# Patient Record
Sex: Male | Born: 1950
Health system: Southern US, Community
[De-identification: ages and names within clinical notes are randomized; demographics above are authoritative.]

## PROBLEM LIST (undated history)

## (undated) DIAGNOSIS — H409 Unspecified glaucoma: Secondary | ICD-10-CM

## (undated) DIAGNOSIS — R519 Headache, unspecified: Secondary | ICD-10-CM

## (undated) DIAGNOSIS — R51 Headache: Secondary | ICD-10-CM

## (undated) DIAGNOSIS — Z86718 Personal history of other venous thrombosis and embolism: Secondary | ICD-10-CM

## (undated) DIAGNOSIS — N200 Calculus of kidney: Secondary | ICD-10-CM

## (undated) DIAGNOSIS — J301 Allergic rhinitis due to pollen: Secondary | ICD-10-CM

## (undated) DIAGNOSIS — F32A Depression, unspecified: Secondary | ICD-10-CM

## (undated) DIAGNOSIS — F329 Major depressive disorder, single episode, unspecified: Secondary | ICD-10-CM

## (undated) DIAGNOSIS — R030 Elevated blood-pressure reading, without diagnosis of hypertension: Secondary | ICD-10-CM

## (undated) DIAGNOSIS — I809 Phlebitis and thrombophlebitis of unspecified site: Secondary | ICD-10-CM

## (undated) DIAGNOSIS — F509 Eating disorder, unspecified: Secondary | ICD-10-CM

## (undated) HISTORY — DX: Headache, unspecified: R51.9

## (undated) HISTORY — DX: Depression, unspecified: F32.A

## (undated) HISTORY — DX: Calculus of kidney: N20.0

## (undated) HISTORY — DX: Allergic rhinitis due to pollen: J30.1

## (undated) HISTORY — DX: Elevated blood-pressure reading, without diagnosis of hypertension: R03.0

## (undated) HISTORY — DX: Unspecified glaucoma: H40.9

## (undated) HISTORY — DX: Eating disorder, unspecified: F50.9

## (undated) HISTORY — DX: Phlebitis and thrombophlebitis of unspecified site: I80.9

## (undated) HISTORY — DX: Personal history of other venous thrombosis and embolism: Z86.718

## (undated) HISTORY — PX: SHOULDER SURGERY: SHX246

## (undated) HISTORY — DX: Headache: R51

## (undated) HISTORY — DX: Major depressive disorder, single episode, unspecified: F32.9

---

## 1998-03-17 HISTORY — PX: CHOLECYSTECTOMY: SHX55

## 1998-03-17 HISTORY — PX: APPENDECTOMY: SHX54

## 1998-03-17 HISTORY — PX: GASTRIC BYPASS: SHX52

## 1999-03-18 HISTORY — PX: OTHER SURGICAL HISTORY: SHX169

## 2001-03-17 HISTORY — PX: GASTRIC BYPASS: SHX52

## 2015-11-14 ENCOUNTER — Ambulatory Visit (INDEPENDENT_AMBULATORY_CARE_PROVIDER_SITE_OTHER): Payer: Medicare Other | Admitting: Family Medicine

## 2015-11-14 ENCOUNTER — Encounter: Payer: Self-pay | Admitting: Family Medicine

## 2015-11-14 VITALS — BP 102/72 | HR 62 | Temp 97.5°F | Ht 70.0 in | Wt 207.4 lb

## 2015-11-14 DIAGNOSIS — Z13220 Encounter for screening for lipoid disorders: Secondary | ICD-10-CM | POA: Diagnosis not present

## 2015-11-14 DIAGNOSIS — Z23 Encounter for immunization: Secondary | ICD-10-CM | POA: Diagnosis not present

## 2015-11-14 DIAGNOSIS — Z131 Encounter for screening for diabetes mellitus: Secondary | ICD-10-CM

## 2015-11-14 DIAGNOSIS — E038 Other specified hypothyroidism: Secondary | ICD-10-CM | POA: Diagnosis not present

## 2015-11-14 DIAGNOSIS — H409 Unspecified glaucoma: Secondary | ICD-10-CM

## 2015-11-14 DIAGNOSIS — Z87828 Personal history of other (healed) physical injury and trauma: Secondary | ICD-10-CM

## 2015-11-14 DIAGNOSIS — Z130 Encounter for screening for diseases of the blood and blood-forming organs and certain disorders involving the immune mechanism: Secondary | ICD-10-CM

## 2015-11-14 DIAGNOSIS — F3170 Bipolar disorder, currently in remission, most recent episode unspecified: Secondary | ICD-10-CM

## 2015-11-14 DIAGNOSIS — E034 Atrophy of thyroid (acquired): Secondary | ICD-10-CM | POA: Diagnosis not present

## 2015-11-14 DIAGNOSIS — R29898 Other symptoms and signs involving the musculoskeletal system: Secondary | ICD-10-CM

## 2015-11-14 NOTE — Patient Instructions (Addendum)
We will arrange for you to see an eye doctor here in town.   I would also recommend that you establish care with a psychiatrist to help monitor your condition and to help if you are ever not doing well.  Unfortunately I cannot make this referral for you, but let me know if you need any other help in this regard.   You might try looking in [facility], [place] or even [place]. [facility] has a serious Psychiatric nurse.   In the meantime, let me know when you need refills of your medications or if you have any other concerns
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
## 2015-11-14 NOTE — Progress Notes (Signed)
Pre visit review using our clinic review tool, if applicable. No additional management support is needed unless otherwise documented below in the visit note. 

## 2015-11-15 ENCOUNTER — Encounter: Payer: Self-pay | Admitting: Family Medicine

## 2015-11-15 LAB — COMPREHENSIVE METABOLIC PANEL
ALBUMIN: 3.5 g/dL (ref 3.5–5.2)
ALT: 17 U/L (ref 0–53)
AST: 31 U/L (ref 0–37)
Alkaline Phosphatase: 81 U/L (ref 39–117)
BILIRUBIN TOTAL: 0.5 mg/dL (ref 0.2–1.2)
BUN: 28 mg/dL — AB (ref 6–23)
CALCIUM: 8.4 mg/dL (ref 8.4–10.5)
CHLORIDE: 105 meq/L (ref 96–112)
CO2: 31 meq/L (ref 19–32)
CREATININE: 1.14 mg/dL (ref 0.40–1.50)
GFR: 68.97 mL/min (ref >60.00)
Glucose, Bld: 90 mg/dL (ref 70–99)
Potassium: 4.3 mEq/L (ref 3.5–5.1)
SODIUM: 140 meq/L (ref 135–145)
Total Protein: 6.3 g/dL (ref 6.0–8.3)

## 2015-11-15 LAB — LIPID PANEL
CHOL/HDL RATIO: 2
CHOLESTEROL: 134 mg/dL (ref 0–200)
HDL: 57.2 mg/dL (ref >39.00)
LDL CALC: 60 mg/dL (ref 0–99)
NonHDL: 76.83
TRIGLYCERIDES: 83 mg/dL (ref 0.0–149.0)
VLDL: 16.6 mg/dL (ref 0.0–40.0)

## 2015-11-15 LAB — TSH: TSH: 3.7 u[IU]/mL (ref 0.35–4.50)

## 2015-11-15 LAB — CBC
HCT: 40.8 % (ref 39.0–52.0)
Hemoglobin: 13.8 g/dL (ref 13.0–17.0)
MCHC: 33.9 g/dL (ref 30.0–36.0)
MCV: 93 fl (ref 78.0–100.0)
PLATELETS: 118 10*3/uL — AB (ref 150.0–400.0)
RBC: 4.38 Mil/uL (ref 4.22–5.81)
RDW: 16.4 % — ABNORMAL HIGH (ref 11.5–15.5)
WBC: 3.7 10*3/uL — ABNORMAL LOW (ref 4.0–10.5)

## 2015-11-15 LAB — HEMOGLOBIN A1C: HEMOGLOBIN A1C: 5.2 % (ref 4.6–6.5)

## 2015-12-10 ENCOUNTER — Telehealth: Payer: Self-pay | Admitting: Family Medicine

## 2015-12-10 ENCOUNTER — Other Ambulatory Visit: Payer: Self-pay | Admitting: Emergency Medicine

## 2015-12-10 MED ORDER — VENLAFAXINE HCL ER 75 MG PO CP24: 225.0000 mg | ORAL_CAPSULE | Freq: Every day | ORAL | 0 refills | Status: DC

## 2015-12-10 NOTE — Telephone Encounter (Signed)
Pt need refill of this medication. He took his last pills today. He would like it to go to pharmacy below today, but would like it to go to [facility] after todays script. venlafaxine XR (EFFEXOR-XR) 75 MG 24 hr capsule   Pharmacy: [facility] [place], [place], [place] [address]   Patient Phone: [phone] Patient Relation: Self

## 2015-12-24 ENCOUNTER — Encounter (HOSPITAL_BASED_OUTPATIENT_CLINIC_OR_DEPARTMENT_OTHER): Payer: Self-pay | Admitting: *Deleted

## 2015-12-24 ENCOUNTER — Emergency Department (HOSPITAL_BASED_OUTPATIENT_CLINIC_OR_DEPARTMENT_OTHER)
Admission: EM | Admit: 2015-12-24 | Discharge: 2015-12-24 | Disposition: A | Discharge status: Home / Self Care | Payer: Medicare Other | Attending: Emergency Medicine | Admitting: Emergency Medicine

## 2015-12-24 ENCOUNTER — Telehealth: Payer: Self-pay | Admitting: Family Medicine

## 2015-12-24 ENCOUNTER — Emergency Department (HOSPITAL_BASED_OUTPATIENT_CLINIC_OR_DEPARTMENT_OTHER): Payer: Medicare Other

## 2015-12-24 DIAGNOSIS — Z87891 Personal history of nicotine dependence: Secondary | ICD-10-CM | POA: Insufficient documentation

## 2015-12-24 DIAGNOSIS — R609 Edema, unspecified: Secondary | ICD-10-CM

## 2015-12-24 DIAGNOSIS — Z7982 Long term (current) use of aspirin: Secondary | ICD-10-CM | POA: Insufficient documentation

## 2015-12-24 DIAGNOSIS — E039 Hypothyroidism, unspecified: Secondary | ICD-10-CM | POA: Insufficient documentation

## 2015-12-24 DIAGNOSIS — R600 Localized edema: Secondary | ICD-10-CM | POA: Diagnosis not present

## 2015-12-24 DIAGNOSIS — M7989 Other specified soft tissue disorders: Secondary | ICD-10-CM | POA: Diagnosis present

## 2015-12-24 LAB — CBC WITH DIFFERENTIAL/PLATELET
BASOS PCT: 0 %
Basophils Absolute: 0 10*3/uL (ref 0.0–0.1)
EOS PCT: 2 %
Eosinophils Absolute: 0.1 10*3/uL (ref 0.0–0.7)
HEMATOCRIT: 37.9 % — AB (ref 39.0–52.0)
Hemoglobin: 13 g/dL (ref 13.0–17.0)
LYMPHS ABS: 1.1 10*3/uL (ref 0.7–4.0)
Lymphocytes Relative: 35 %
MCH: 31.8 pg (ref 26.0–34.0)
MCHC: 34.3 g/dL (ref 30.0–36.0)
MCV: 92.7 fL (ref 78.0–100.0)
MONOS PCT: 11 %
Monocytes Absolute: 0.3 10*3/uL (ref 0.1–1.0)
NEUTROS ABS: 1.6 10*3/uL — AB (ref 1.7–7.7)
Neutrophils Relative %: 52 %
Platelets: 116 10*3/uL — ABNORMAL LOW (ref 150–400)
RBC: 4.09 MIL/uL — ABNORMAL LOW (ref 4.22–5.81)
RDW: 14.9 % (ref 11.5–15.5)
Smear Review: ADEQUATE
WBC: 3.1 10*3/uL — ABNORMAL LOW (ref 4.0–10.5)

## 2015-12-24 LAB — COMPREHENSIVE METABOLIC PANEL
ALK PHOS: 70 U/L (ref 38–126)
ALT: 15 U/L — AB (ref 17–63)
AST: 25 U/L (ref 15–41)
Albumin: 3.3 g/dL — ABNORMAL LOW (ref 3.5–5.0)
Anion gap: 6 (ref 5–15)
BUN: 29 mg/dL — AB (ref 6–20)
CHLORIDE: 105 mmol/L (ref 101–111)
CO2: 28 mmol/L (ref 22–32)
CREATININE: 1.06 mg/dL (ref 0.61–1.24)
Calcium: 8.4 mg/dL — ABNORMAL LOW (ref 8.9–10.3)
GFR calc Af Amer: >60 mL/min (ref >60)
GFR calc non Af Amer: >60 mL/min (ref >60)
Glucose, Bld: 87 mg/dL (ref 65–99)
Potassium: 3.9 mmol/L (ref 3.5–5.1)
SODIUM: 139 mmol/L (ref 135–145)
Total Bilirubin: 0.5 mg/dL (ref 0.3–1.2)
Total Protein: 6 g/dL — ABNORMAL LOW (ref 6.5–8.1)

## 2015-12-24 LAB — BRAIN NATRIURETIC PEPTIDE: B NATRIURETIC PEPTIDE 5: 136.6 pg/mL — AB (ref 0.0–100.0)

## 2015-12-24 LAB — TROPONIN I: Troponin I: <0.03 ng/mL (ref <0.03)

## 2015-12-24 NOTE — ED Provider Notes (Signed)
Bryan Hodges Note   CSN: [id number] Arrival date & time: 12/24/15  1756   By signing my name below, I, Bryan Hodges and Bryan Hodges, attest that this documentation has been prepared under the direction and in the presence of Montine Circle, PA-C. Electronically Signed: [name] and Bryan Hodges, ED Scribe. 12/24/15. 8:08 PM.  History   Chief Complaint Chief Complaint  Patient presents with  . Leg Pain   The history is provided by the patient. No language interpreter was used.     HPI Comments: Bryan Hodges is a 65 y.o. male who presents to the Emergency Department complaining of moderate,  gradually worsening, pain and swelling in BLE onset 3 days ago. Pt reports associated clear drainage from left leg. Pt reports recent prolonged periods of standing, but denies recent injury or long travel. Pt was seen at [facility] today and sent to ED for further evaluation of possible DVT. He reports he takes qd Lasix. Pt denies h/o CHF. He also denies CP, SOB.   Past Medical History:  Diagnosis Date  . Depression   . Eating disorder   . Elevated blood pressure reading   . Frequent headaches   . Glaucoma   . H/O blood clots   . Hay fever   . Kidney stone   . Phlebitis     Patient Active Problem List   Diagnosis Date Noted  . Hypothyroidism due to acquired atrophy of thyroid 11/14/2015  . Glaucoma 11/14/2015  . Leg weakness, bilateral 11/14/2015  . History of back injury 11/14/2015  . Bipolar affective disorder in remission ([place]) 11/14/2015    Past Surgical History:  Procedure Laterality Date  . APPENDECTOMY  2000  . CHOLECYSTECTOMY  2000  . GASTRIC BYPASS  2000  . GASTRIC BYPASS  2003  . OTHER SURGICAL HISTORY  2001   Reconstructive surgery        Home Medications    Prior to Admission medications   Medication Sig Start Date End Date Taking? Authorizing Hodges  allopurinol (ZYLOPRIM) 300 MG tablet Take 1 tablet by mouth daily. 10/16/15    Historical Provider, MD  aspirin 81 MG tablet Take 81 mg by mouth daily.    Historical Provider, MD  divalproex (DEPAKOTE) 500 MG DR tablet Take 500 mg by mouth 3 (three) times daily.    Historical Provider, MD  dorzolamide-timolol (COSOPT) 22.3-6.8 MG/ML ophthalmic solution Place 1 drop into both eyes 2 (two) times daily. 09/18/15   Historical Provider, MD  furosemide (LASIX) 20 MG tablet Take 1 tablet by mouth 2 (two) times daily. 11/10/15   Historical Provider, MD  gabapentin (NEURONTIN) 800 MG tablet Take 800 mg by mouth 3 (three) times daily.    Historical Provider, MD  latanoprost (XALATAN) 0.005 % ophthalmic solution Place 1 drop into both eyes daily. 09/18/15   Historical Provider, MD  levothyroxine (SYNTHROID, LEVOTHROID) 50 MCG tablet Take 50 mcg by mouth daily before breakfast.    Historical Provider, MD  omeprazole (PRILOSEC) 40 MG capsule Take 1 capsule by mouth daily. 10/16/15   Historical Provider, MD  primidone (MYSOLINE) 50 MG tablet Take 50 mg by mouth daily.    Historical Provider, MD  propranolol (INDERAL) 10 MG tablet Take 10 mg by mouth 2 (two) times daily.    Historical Provider, MD  tamsulosin (FLOMAX) 0.4 MG CAPS capsule Take 1 capsule by mouth daily. 10/16/15   Historical Provider, MD  traMADol (ULTRAM) 50 MG tablet Take 1 tablet by mouth 2 (two)  times daily. 09/17/15   Historical Provider, MD  venlafaxine XR (EFFEXOR-XR) 75 MG 24 hr capsule Take 3 capsules (225 mg total) by mouth daily. 12/10/15   Gay Filler Copland, MD  vitamin B-12 (CYANOCOBALAMIN) 500 MCG tablet Take 500 mcg by mouth daily.    Historical Provider, MD    Family History Family History  Problem Relation Age of Onset  . Arthritis Mother   . Hyperlipidemia Mother   . Heart disease Mother   . Hypertension Mother   . Alcohol abuse Mother   . Heart attack Mother   . Hyperlipidemia Father   . Heart disease Father   . Hypertension Father   . Alcohol abuse Sister   . Other Sister     Brain tumor  . Alcohol abuse  Brother   . Other Brother     Brain tumor  . Ovarian cancer Paternal Aunt   . Diabetes Paternal Aunt   . Stroke Paternal Grandmother     Social History Social History  Substance Use Topics  . Smoking status: Former Smoker    Types: Cigarettes    Quit date: 03/17/1976  . Smokeless tobacco: Former Systems developer    Types: Chew  . Alcohol use Yes     Allergies   Review of patient's allergies indicates no known allergies.   Review of Systems Review of Systems  Respiratory: Negative for shortness of breath.   Cardiovascular: Positive for leg swelling. Negative for chest pain.  Musculoskeletal: Positive for myalgias (legs).  All other systems reviewed and are negative.    Physical Exam Updated Vital Signs BP 108/75   Pulse 65   Temp 97.8 F (36.6 C) (Oral)   Resp 18   Ht 5\' 11"  (1.803 m)   Wt 209 lb (94.8 kg)   SpO2 100%   BMI 29.15 kg/m   Physical Exam Physical Exam  Constitutional: Pt appears well-developed and well-nourished. No distress.  Awake, alert, nontoxic appearance  HENT:  Head: Normocephalic and atraumatic.  Mouth/Throat: Oropharynx is clear and moist. No oropharyngeal exudate.  Eyes: Conjunctivae are normal. No scleral icterus.  Neck: Normal range of motion. Neck supple.  Cardiovascular: Normal rate, regular rhythm and intact distal pulses.   Pulmonary/Chest: Effort normal and breath sounds normal. No respiratory distress. Pt has no wheezes.  Equal chest expansion  Abdominal: Soft. Bowel sounds are normal. Pt exhibits no mass. There is no tenderness. There is no rebound and no guarding.  Musculoskeletal: Normal range of motion. Bilateral edema, mild weeping, no erythema or sign of cellulitis or abscess.  Neurological: Pt is alert.  Speech is clear and goal oriented Moves extremities without ataxia  Skin: Skin is warm and dry. Pt is not diaphoretic.  Psychiatric: Pt has a normal mood and affect.  Nursing note and vitals reviewed.    ED Treatments /  Results   DIAGNOSTIC STUDIES: Oxygen Saturation is 100% on RA, normal by my interpretation.    COORDINATION OF CARE: 8:06 PM Discussed treatment plan with pt at bedside which includes lab work, [place] BLE and pt agreed to plan.  Labs (all labs ordered are listed, but only abnormal results are displayed) Labs Reviewed  CBC WITH DIFFERENTIAL/PLATELET - Abnormal; Notable for the following:       Result Value   WBC 3.1 (*)    RBC 4.09 (*)    HCT 37.9 (*)    All other components within normal limits  BRAIN NATRIURETIC PEPTIDE  COMPREHENSIVE METABOLIC PANEL  TROPONIN I  EKG  EKG Interpretation None       Radiology No results found.  Procedures Procedures (including critical care time)  Medications Ordered in ED Medications - No data to display   Initial Impression / Assessment and Plan / ED Course  I have reviewed the triage vital signs and the nursing notes.  Pertinent labs & imaging results that were available during my care of the patient were reviewed by me and considered in my medical decision making (see chart for details).  Clinical Course    Patient with bilateral edema in lower extremities. Sent to ED to rule out DVT. Ultrasounds are negative for DVT. Patient has no chest pain or shortness breath. Vital signs are stable. He does have a mildly elevated BNP. Patient may benefit from having an outpatient echocardiogram. I have discussed this with the patient, who will discuss it with his primary care doctor. I also recommended that the patient use compression stockings for the next week or so and keep his feet up and elevated.  Final Clinical Impressions(s) / ED Diagnoses   Final diagnoses:  Peripheral edema    New Prescriptions New Prescriptions   No medications on file   I personally performed the services described in this documentation, which was scribed in my presence. The recorded information has been reviewed and is accurate.      Montine Circle,  PA-C 12/24/15 Brent, MD 12/24/15 [phone]

## 2015-12-24 NOTE — Discharge Instructions (Signed)
No blood clots were seen on the ultrasounds today.   Try wearing compression stockings for the next several days. Please follow-up with your doctor.  They may want to order an echocardiogram of your heart if you have not had one recently.

## 2015-12-24 NOTE — ED Triage Notes (Signed)
Pain and swelling in his legs. [facility].

## 2015-12-24 NOTE — Telephone Encounter (Incomplete)
[facility] - Client TELEPHONE ADVICE RECORD   [facility]     Patient Name: Bryan Hodges Initial Comment Caller states her husbands feet and legs are swollen and he has little tiny holes that are leaking clear fluid.   DOB: 06-20-1950      Nurse Assessment  Nurse: Luther Parody, RN, Malachy Mood Date/Time (Eastern Time): 12/24/2015 3:32:56 PM  Confirm and document reason for call. If symptomatic, describe symptoms. You must click the next button to save text entered. ---Caller states that husband has had swelling in both lower extremities for the last 2 days and last pm the left started to weep fluid.  Has the patient traveled out of the country within the last 30 days? ---Not Applicable  Does the patient have any new or worsening symptoms? ---Yes  Will a triage be completed? ---Yes  Related visit to physician within the last 2 weeks? ---No  Does the PT have any chronic conditions? (i.e. diabetes, asthma, etc.) ---Yes  List chronic conditions. ---chronic back pain, PTSD, bipolar  Is this a behavioral health or substance abuse call? ---No    Guidelines     Guideline Title Affirmed Question Affirmed Notes   Leg Swelling and Edema SEVERE leg swelling (e.g., swelling extends above knee, entire leg is swollen, weeping fluid)    Final Disposition User   See Physician within 4 Hours (or PCP triage) Luther Parody, RN, Cheryl     Referrals   GO TO FACILITY OTHER - SPECIFY   Disagree/Comply: Comply

## 2015-12-31 ENCOUNTER — Ambulatory Visit (HOSPITAL_BASED_OUTPATIENT_CLINIC_OR_DEPARTMENT_OTHER)
Admission: RE | Admit: 2015-12-31 | Discharge: 2015-12-31 | Disposition: A | Discharge status: Home / Self Care | Payer: Medicare Other | Source: Ambulatory Visit | Attending: Family Medicine | Admitting: Family Medicine

## 2015-12-31 ENCOUNTER — Encounter: Payer: Self-pay | Admitting: Family Medicine

## 2015-12-31 ENCOUNTER — Ambulatory Visit (INDEPENDENT_AMBULATORY_CARE_PROVIDER_SITE_OTHER): Payer: Medicare Other | Admitting: Family Medicine

## 2015-12-31 VITALS — BP 106/72 | HR 75 | Temp 98.1°F | Ht 71.0 in | Wt 213.0 lb

## 2015-12-31 DIAGNOSIS — J3089 Other allergic rhinitis: Secondary | ICD-10-CM | POA: Diagnosis not present

## 2015-12-31 DIAGNOSIS — R600 Localized edema: Secondary | ICD-10-CM | POA: Diagnosis not present

## 2015-12-31 NOTE — Progress Notes (Signed)
Pre visit review using our clinic review tool, if applicable. No additional management support is needed unless otherwise documented below in the visit note. 
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
## 2015-12-31 NOTE — Patient Instructions (Signed)
Please go to the ground floor imaging department to have your chest x-ray. Then you can go home and I will be in touch with this result. We will also schedule an echocardiogram to make sure your heart is squeezing well.    I will refer you to an allergist to have allergy testing

## 2016-01-09 ENCOUNTER — Telehealth: Payer: Self-pay | Admitting: Family Medicine

## 2016-01-09 ENCOUNTER — Other Ambulatory Visit: Payer: Self-pay | Admitting: Emergency Medicine

## 2016-01-09 MED ORDER — VENLAFAXINE HCL ER 75 MG PO CP24: 225.0000 mg | ORAL_CAPSULE | Freq: Every day | ORAL | 0 refills | Status: DC

## 2016-01-09 MED ORDER — VENLAFAXINE HCL ER 75 MG PO CP24
225.0000 mg | ORAL_CAPSULE | Freq: Every day | ORAL | 0 refills | Status: DC
Start: 2016-01-09 — End: 2016-06-17

## 2016-01-09 NOTE — Telephone Encounter (Signed)
Refill sent per pt request.  

## 2016-01-09 NOTE — Telephone Encounter (Signed)
Caller name: [name] Relationship to patient: Wife Can be reached: [phone] Pharmacy:  [facility], [place] [phone] (Phone) [phone] (Fax)     Reason for call: Request a rx for venlafaxine XR (EFFEXOR-XR) 75 MG 24 hr capsule [id number]    Patient is out of this medication and also request that enough be called in to [facility], [place]. [phone] (Phone) [phone] (Fax)   To last until he receives his mail order

## 2016-01-16 ENCOUNTER — Ambulatory Visit (HOSPITAL_BASED_OUTPATIENT_CLINIC_OR_DEPARTMENT_OTHER)
Admission: RE | Admit: 2016-01-16 | Discharge: 2016-01-16 | Disposition: A | Discharge status: Home / Self Care | Payer: Medicare Other | Source: Ambulatory Visit | Attending: Family Medicine | Admitting: Family Medicine

## 2016-01-16 DIAGNOSIS — R600 Localized edema: Secondary | ICD-10-CM | POA: Diagnosis not present

## 2016-01-16 NOTE — Progress Notes (Signed)
Echocardiogram 2D Echocardiogram has been performed.  Aggie Cosier 01/16/2016, 3:52 PM

## 2016-01-17 ENCOUNTER — Ambulatory Visit: Payer: Self-pay | Admitting: Allergy and Immunology

## 2016-01-17 ENCOUNTER — Telehealth: Payer: Self-pay | Admitting: Family Medicine

## 2016-01-17 DIAGNOSIS — I5022 Chronic systolic (congestive) heart failure: Secondary | ICD-10-CM

## 2016-01-17 NOTE — Telephone Encounter (Signed)
Called to go over his echo- he does have a reduced EF in the 40- 45% range. Reached his wife as Bryan Hodges was not home.  She reports no known history of cardiac problems in the past. He is not doing weights- she will ask him to weigh each morning and contact me next week with an update. Currently on lasix 20 once daily Will refer to cardiology

## 2016-01-18 DIAGNOSIS — H401134 Primary open-angle glaucoma, bilateral, indeterminate stage: Secondary | ICD-10-CM | POA: Diagnosis not present

## 2016-01-18 DIAGNOSIS — Z961 Presence of intraocular lens: Secondary | ICD-10-CM | POA: Diagnosis not present

## 2016-01-18 DIAGNOSIS — Z9889 Other specified postprocedural states: Secondary | ICD-10-CM | POA: Diagnosis not present

## 2016-01-20 ENCOUNTER — Other Ambulatory Visit: Payer: Self-pay | Admitting: Family Medicine

## 2016-01-21 ENCOUNTER — Other Ambulatory Visit: Payer: Self-pay | Admitting: Emergency Medicine

## 2016-02-12 ENCOUNTER — Ambulatory Visit: Payer: Medicare Other | Admitting: Physician Assistant

## 2016-02-20 ENCOUNTER — Other Ambulatory Visit: Payer: Self-pay | Admitting: Family Medicine

## 2016-02-20 ENCOUNTER — Telehealth: Payer: Self-pay | Admitting: Family Medicine

## 2016-02-20 ENCOUNTER — Other Ambulatory Visit: Payer: Self-pay | Admitting: Emergency Medicine

## 2016-02-20 NOTE — Telephone Encounter (Signed)
Patient's spouse is calling to request refills of patient's  propranolol (INDERAL) 10 MG tablet levothyroxine (SYNTHROID, LEVOTHROID) 50 MCG tablet  venlafaxine XR (EFFEXOR-XR) 75 MG 24 hr capsule Vitamin D-3   Pharmacy: [facility]  [address] [place], [place] [address] [phone]

## 2016-02-21 ENCOUNTER — Other Ambulatory Visit: Payer: Self-pay | Admitting: Emergency Medicine

## 2016-02-21 MED ORDER — VITAMIN B-12 500 MCG PO TABS: 500.0000 ug | ORAL_TABLET | Freq: Every day | ORAL | 3 refills | Status: AC

## 2016-02-21 MED ORDER — PROPRANOLOL HCL 10 MG PO TABS: 10.0000 mg | ORAL_TABLET | Freq: Two times a day (BID) | ORAL | 3 refills | Status: DC

## 2016-02-21 MED ORDER — LEVOTHYROXINE SODIUM 50 MCG PO TABS: 50.0000 ug | ORAL_TABLET | Freq: Every day | ORAL | 3 refills | Status: DC

## 2016-02-21 MED ORDER — VENLAFAXINE HCL ER 75 MG PO CP24: 225.0000 mg | ORAL_CAPSULE | Freq: Every day | ORAL | 3 refills | Status: DC

## 2016-02-21 NOTE — Telephone Encounter (Signed)
Refills sent as requested

## 2016-02-21 NOTE — Telephone Encounter (Signed)
Received refill request for Propranolon,Levothyroxine, Venlafaxine, and Vitamin B12. Sent refills to [facility] on [address] per pt request

## 2016-03-03 ENCOUNTER — Ambulatory Visit: Payer: Medicare Other | Admitting: Internal Medicine

## 2016-03-19 NOTE — Progress Notes (Addendum)
Subjective:   Bryan Hodges is a 66 y.o. male who presents for an Initial Medicare Annual Wellness Visit.  Review of Systems  No ROS.  Medicare Wellness Visit. Cardiac Risk Factors include: advanced age (>72men, >28 women);male gender;sedentary lifestyle Sleep patterns: Takes Zquil to sleep. Sleep about 10 hrs. Feels rested. Home Safety/Smoke Alarms: Smoke detectors in place.  Living environment; residence and Firearm Safety: Lives with wife in town home. Guns properly stored. Feels safe. Seat Belt Safety/Bike Helmet: Wears seat belt.   Counseling:   Eye Exam- has appt this month.  Dental- Looking for new dentist. Moved here 6 months ago.  Male:   CCS-  Per [id number] year. Requesting records  PSA- No results found for: PSA Requesting records      Objective:    Today's Vitals   03/20/16 1427  BP: 105/60  Pulse: 65  SpO2: 98%  Weight: 205 lb (93 kg)  Height: 5\' 11"  (1.803 m)   Body mass index is 28.59 kg/m.  Current Medications (verified) Outpatient Encounter Prescriptions as of 03/20/2016  Medication Sig  . allopurinol (ZYLOPRIM) 300 MG tablet Take 1 tablet by mouth daily.  Marland Kitchen aspirin 81 MG tablet Take 81 mg by mouth daily.  . divalproex (DEPAKOTE) 500 MG DR tablet Take 500 mg by mouth 3 (three) times daily.  . dorzolamide-timolol (COSOPT) 22.3-6.8 MG/ML ophthalmic solution Place 1 drop into both eyes 2 (two) times daily.  . furosemide (LASIX) 20 MG tablet Take 1 tablet by mouth 2 (two) times daily.  Marland Kitchen gabapentin (NEURONTIN) 800 MG tablet Take 800 mg by mouth 3 (three) times daily.  Marland Kitchen latanoprost (XALATAN) 0.005 % ophthalmic solution Place 1 drop into both eyes daily.  Marland Kitchen levothyroxine (SYNTHROID, LEVOTHROID) 50 MCG tablet Take 1 tablet (50 mcg total) by mouth daily before breakfast.  . omeprazole (PRILOSEC) 40 MG capsule Take 1 capsule by mouth daily.  . primidone (MYSOLINE) 50 MG tablet Take 50 mg by mouth daily.  . propranolol (INDERAL) 10 MG tablet Take 1 tablet (10 mg  total) by mouth 2 (two) times daily.  . tamsulosin (FLOMAX) 0.4 MG CAPS capsule Take 1 capsule by mouth daily.  . traMADol (ULTRAM) 50 MG tablet Take 1 tablet by mouth 2 (two) times daily.  Marland Kitchen venlafaxine XR (EFFEXOR-XR) 75 MG 24 hr capsule Take 3 capsules (225 mg total) by mouth daily.  Marland Kitchen venlafaxine XR (EFFEXOR-XR) 75 MG 24 hr capsule Take 3 capsules (225 mg total) by mouth daily.  . vitamin B-12 (CYANOCOBALAMIN) 500 MCG tablet Take 1 tablet (500 mcg total) by mouth daily.   No facility-administered encounter medications on file as of 03/20/2016.     Allergies (verified) Patient has no known allergies.   History: Past Medical History:  Diagnosis Date  . Depression   . Eating disorder   . Elevated blood pressure reading   . Frequent headaches   . Glaucoma   . H/O blood clots   . Hay fever   . Kidney stone   . Phlebitis    Past Surgical History:  Procedure Laterality Date  . APPENDECTOMY  2000  . CHOLECYSTECTOMY  2000  . GASTRIC BYPASS  2000  . GASTRIC BYPASS  2003  . OTHER SURGICAL HISTORY  2001   Reconstructive surgery    Family History  Problem Relation Age of Onset  . Arthritis Mother   . Hyperlipidemia Mother   . Heart disease Mother   . Hypertension Mother   . Alcohol abuse Mother   .  Heart attack Mother   . Hyperlipidemia Father   . Heart disease Father   . Hypertension Father   . Alcohol abuse Sister   . Other Sister     Brain tumor  . Alcohol abuse Brother   . Other Brother     Brain tumor  . Ovarian cancer Paternal Aunt   . Diabetes Paternal Aunt   . Stroke Paternal Grandmother    Social History   Occupational History  . Not on file.   Social History Main Topics  . Smoking status: Former Smoker    Types: Cigarettes    Quit date: 03/17/1976  . Smokeless tobacco: Former Systems developer    Types: Chew  . Alcohol use Yes     Comment: occasoinal beer  . Drug use: No  . Sexual activity: Not on file   Tobacco Counseling Counseling given: No   Activities  of Daily Living In your present state of health, do you have any difficulty performing the following activities: 03/20/2016  Hearing? N  Vision? N  Difficulty concentrating or making decisions? Y  Walking or climbing stairs? Y  Dressing or bathing? N  Doing errands, shopping? N  Preparing Food and eating ? N  Using the Toilet? N  In the past six months, have you accidently leaked urine? N  Do you have problems with loss of bowel control? N  Managing your Medications? N  Managing your Finances? N  Housekeeping or managing your Housekeeping? N    Immunizations and Health Maintenance Immunization History  Administered Date(s) Administered  . Influenza,inj,Quad PF,36+ Mos 11/14/2015   Health Maintenance Due  Topic Date Due  . Hepatitis C Screening  Aug 01, 1950  . HIV Screening  12/08/1967  . TETANUS/TDAP  12/08/1971  . COLONOSCOPY  12/08/2002  . ZOSTAVAX  12/07/2012    Patient Care Team: Darreld Mclean, MD as PCP - General (Family Medicine)  Indicate any recent Medical Services you may have received from other than [facility] providers in the past year (date may be approximate).    Assessment:   This is a routine wellness examination for Bryan Hodges. Physical assessment deferred to PCP.  Hearing/Vision screen  Hearing Screening   125Hz  250Hz  500Hz  1000Hz  2000Hz  3000Hz  4000Hz  6000Hz  8000Hz   Right ear:   Pass Pass Pass  Fail    Left ear:   Pass Pass Pass  Fail    Comments: Able to hear conversational tones w/o difficulty. No issues reported.     Visual Acuity Screening   Right eye Left eye Both eyes  Without correction: 20/20 20/20 20/20   With correction:       Dietary issues and exercise activities discussed: Current Exercise Habits: The patient does not participate in regular exercise at present, Exercise limited by: orthopedic condition(s)     Goals    . To have less chronic pain.      Depression Screen PHQ 2/9 Scores 03/20/2016  PHQ - 2 Score 0    Fall Risk Fall Risk   03/20/2016  Falls in the past year? No    Cognitive Function: MMSE - Mini Mental State Exam 03/20/2016  Orientation to time 5  Orientation to Place 5  Registration 3  Attention/ Calculation 5  Recall 2  Language- name 2 objects 2  Language- repeat 1  Language- follow 3 step command 3  Language- read & follow direction 1  Write a sentence 1  Copy design 1  Total score 29        Screening Tests  Health Maintenance  Topic Date Due  . Hepatitis C Screening  25-Jan-1951  . HIV Screening  12/08/1967  . TETANUS/TDAP  12/08/1971  . COLONOSCOPY  12/08/2002  . ZOSTAVAX  12/07/2012  . INFLUENZA VACCINE  Completed        Plan:     Follow up with Dr.Copland as scheduled. Continue to eat heart healthy diet (full of fruits, vegetables, whole grains, lean protein, water--limit salt, fat, and sugar intake) and increase physical activity as tolerated. Continue doing brain stimulating activities (puzzles, reading, adult coloring books, staying active) to keep memory sharp.   During the course of the visit Bryan Hodges was educated and counseled about the following appropriate screening and preventive services:   Vaccines to include Pneumoccal, Influenza, Hepatitis B, Td, Zostavax, HCV  Colorectal cancer screening  Cardiovascular disease screening  Diabetes screening  Glaucoma screening  Nutrition counseling  Prostate cancer screening  Patient Instructions (the written plan) were given to the patient.   Shela Nevin, [place]   03/20/2016     Read and reviewed, agree with above J Copland MD 03/24/16

## 2016-03-19 NOTE — Progress Notes (Signed)
Pre visit review using our clinic review tool, if applicable. No additional management support is needed unless otherwise documented below in the visit note. 

## 2016-03-20 ENCOUNTER — Ambulatory Visit (INDEPENDENT_AMBULATORY_CARE_PROVIDER_SITE_OTHER): Payer: Medicare Other | Admitting: *Deleted

## 2016-03-20 ENCOUNTER — Encounter: Payer: Self-pay | Admitting: *Deleted

## 2016-03-20 VITALS — BP 105/60 | HR 65 | Ht 71.0 in | Wt 205.0 lb

## 2016-03-20 DIAGNOSIS — Z0000 Encounter for general adult medical examination without abnormal findings: Secondary | ICD-10-CM | POA: Diagnosis not present

## 2016-03-20 NOTE — Patient Instructions (Signed)
Follow up with Dr.Copland as scheduled. Continue to eat heart healthy diet (full of fruits, vegetables, whole grains, lean protein, water--limit salt, fat, and sugar intake) and increase physical activity as tolerated. Continue doing brain stimulating activities (puzzles, reading, adult coloring books, staying active) to keep memory sharp.

## 2016-03-26 ENCOUNTER — Ambulatory Visit (HOSPITAL_BASED_OUTPATIENT_CLINIC_OR_DEPARTMENT_OTHER)
Admission: RE | Admit: 2016-03-26 | Discharge: 2016-03-26 | Disposition: A | Discharge status: Home / Self Care | Payer: Medicare Other | Source: Ambulatory Visit | Attending: Family Medicine | Admitting: Family Medicine

## 2016-03-26 ENCOUNTER — Ambulatory Visit (INDEPENDENT_AMBULATORY_CARE_PROVIDER_SITE_OTHER): Payer: Medicare Other | Admitting: Family Medicine

## 2016-03-26 ENCOUNTER — Encounter: Payer: Self-pay | Admitting: Family Medicine

## 2016-03-26 VITALS — BP 94/68 | HR 80 | Temp 97.7°F | Ht 71.0 in | Wt 206.6 lb

## 2016-03-26 DIAGNOSIS — M25552 Pain in left hip: Secondary | ICD-10-CM | POA: Diagnosis not present

## 2016-03-26 DIAGNOSIS — R200 Anesthesia of skin: Secondary | ICD-10-CM

## 2016-03-26 DIAGNOSIS — M25551 Pain in right hip: Secondary | ICD-10-CM | POA: Diagnosis not present

## 2016-03-26 DIAGNOSIS — M160 Bilateral primary osteoarthritis of hip: Secondary | ICD-10-CM | POA: Insufficient documentation

## 2016-03-26 DIAGNOSIS — I519 Heart disease, unspecified: Secondary | ICD-10-CM | POA: Insufficient documentation

## 2016-03-26 DIAGNOSIS — R600 Localized edema: Secondary | ICD-10-CM | POA: Insufficient documentation

## 2016-03-26 DIAGNOSIS — R251 Tremor, unspecified: Secondary | ICD-10-CM
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
## 2016-03-26 NOTE — Progress Notes (Signed)
Pre visit review using our clinic review tool, if applicable. No additional management support is needed unless otherwise documented below in the visit note. 

## 2016-03-26 NOTE — Patient Instructions (Addendum)
Please go to the ground floor imaging to have x-rays of your hips We are going to have your see neurology for further evaluation of your tremor  I will also order an MRI for your lower back to evaluate you for spinal stenosis.   Please see me in 3 months for a recheck and labs  Try using your lasix only as needed- you may not need it every day and this would be one less medication to take

## 2016-03-27 ENCOUNTER — Encounter: Payer: Self-pay | Admitting: Family Medicine

## 2016-03-29 ENCOUNTER — Ambulatory Visit (HOSPITAL_BASED_OUTPATIENT_CLINIC_OR_DEPARTMENT_OTHER): Payer: Medicare Other

## 2016-03-29 ENCOUNTER — Other Ambulatory Visit (HOSPITAL_BASED_OUTPATIENT_CLINIC_OR_DEPARTMENT_OTHER): Payer: Medicare Other

## 2016-04-05 ENCOUNTER — Ambulatory Visit (HOSPITAL_BASED_OUTPATIENT_CLINIC_OR_DEPARTMENT_OTHER)
Admission: RE | Admit: 2016-04-05 | Discharge: 2016-04-05 | Disposition: A | Discharge status: Home / Self Care | Payer: Medicare Other | Source: Ambulatory Visit | Attending: Family Medicine | Admitting: Family Medicine

## 2016-04-05 ENCOUNTER — Other Ambulatory Visit (HOSPITAL_BASED_OUTPATIENT_CLINIC_OR_DEPARTMENT_OTHER): Payer: Medicare Other

## 2016-04-05 DIAGNOSIS — M5136 Other intervertebral disc degeneration, lumbar region: Secondary | ICD-10-CM | POA: Insufficient documentation

## 2016-04-05 DIAGNOSIS — R200 Anesthesia of skin: Secondary | ICD-10-CM | POA: Diagnosis not present

## 2016-04-05 DIAGNOSIS — M545 Low back pain: Secondary | ICD-10-CM | POA: Diagnosis not present

## 2016-04-09 ENCOUNTER — Telehealth: Payer: Self-pay | Admitting: Family Medicine

## 2016-04-09 DIAGNOSIS — R937 Abnormal findings on diagnostic imaging of other parts of musculoskeletal system: Secondary | ICD-10-CM

## 2016-04-09 NOTE — Telephone Encounter (Signed)
Spoke with his wife about his MRI.  They would like to see NSG about his back and leg pains. Will arrange for them

## 2016-04-13 ENCOUNTER — Telehealth: Payer: Self-pay | Admitting: Family Medicine

## 2016-04-13 NOTE — Telephone Encounter (Signed)
Received records from [facility]

## 2016-04-14 ENCOUNTER — Ambulatory Visit: Payer: Medicare Other | Admitting: Internal Medicine

## 2016-04-22 ENCOUNTER — Ambulatory Visit: Payer: Medicare Other | Admitting: Neurology

## 2016-04-28 ENCOUNTER — Encounter: Payer: Self-pay | Admitting: Neurology

## 2016-04-28 ENCOUNTER — Ambulatory Visit (INDEPENDENT_AMBULATORY_CARE_PROVIDER_SITE_OTHER): Payer: Medicare Other | Admitting: Neurology

## 2016-04-28 VITALS — BP 94/67 | HR 63 | Resp 20 | Ht 70.0 in | Wt 210.0 lb

## 2016-04-28 DIAGNOSIS — G250 Essential tremor: Secondary | ICD-10-CM

## 2016-04-28 MED ORDER — PRIMIDONE 50 MG PO TABS: ORAL_TABLET | ORAL | 5 refills | Status: DC

## 2016-04-28 NOTE — Progress Notes (Signed)
Subjective:    Patient ID: Bryan Hodges is a 66 y.o. male.  HPI     Star Age, MD, PhD [facility] [address], [address] [place], [place] [address]  Dear Dr. Lorelei Pont,   I saw your patient, Bryan Hodges, upon your kind request in my neurologic clinic today for initial consultation of his tremors. The patient is unaccompanied today. As you know, Bryan Hodges is a 66 year old right-handed gentleman with an underlying complex medical history of morbid obesity, status post gastric bypass surgery, bipolar disorder, eating disorder, glaucoma, history of DVTs, kidney stone, phlebitis, back injury, back pain, previously on narcotic pain medications, and overweight state, who reports a longstanding history of bilateral upper extremity tremor of maybe 3-5 years duration. He saw his primary care physician when he still was in [place] and was started on one medication and when they moved to [place] a second medication was added and he saw a neurologist one time. He has a history of low blood pressure and therefore he is just on a low-dose of propranolol at this time, 10 mg twice daily and he has been on Mysoline 50 mg each evening. He has no family history of Parkinson's disease or tremors. He had 3 brothers and 3 sisters, now only 2 sisters are alive, father had multiple sclerosis but died of heart disease at the age 72. Patient has a history of bipolar disorder and has been on high-dose venlafaxine for about 3 years, has been on Depakote longer him a maybe 3-5 years. His tremor is worse on the right side. He has ongoing issues with chronic pain. He is no longer on narcotic pain medication. Has a history of nerve damage. Had EMG and nerve conduction testing in the past and was told he had nerve damage from the back. He had a L spine MRI on 04/05/16, which I reviewed: IMPRESSION: Curvature convex to the right with the apex at L2-3.   Degenerative disc disease and degenerative  facet disease that could be associated with back pain. No clear compressive stenosis is identified. There is mild narrowing of left lateral recess at L2-3, bilateral lateral recesses at L3-4 and of the intervertebral foramen on the right at L5-S1, but definite neural compression is not demonstrated in these locations.   He has an appointment with NSG next week.   He is on disability. He has 5 children. He drinks 1 beer at night, likes to drink sweet tea, typically no sodas, quit smoking in 1978.  His Past Medical History Is Significant For: Past Medical History:  Diagnosis Date  . Depression   . Eating disorder   . Elevated blood pressure reading   . Frequent headaches   . Glaucoma   . H/O blood clots   . Hay fever   . Kidney stone   . Phlebitis     His Past Surgical History Is Significant For: Past Surgical History:  Procedure Laterality Date  . APPENDECTOMY  2000  . CHOLECYSTECTOMY  2000  . GASTRIC BYPASS  2000  . GASTRIC BYPASS  2003  . OTHER SURGICAL HISTORY  2001   Reconstructive surgery   . SHOULDER SURGERY      His Family History Is Significant For: Family History  Problem Relation Age of Onset  . Arthritis Mother   . Hyperlipidemia Mother   . Heart disease Mother   . Hypertension Mother   . Alcohol abuse Mother   . Heart attack Mother   . Hyperlipidemia  Father   . Heart disease Father   . Hypertension Father   . Alcohol abuse Sister   . Other Sister     Brain tumor  . Alcohol abuse Brother   . Other Brother     Brain tumor  . Ovarian cancer Paternal Aunt   . Diabetes Paternal Aunt   . Stroke Paternal Grandmother     His Social History Is Significant For: Social History   Social History  . Marital status: Married    Spouse name: N/A  . Number of children: N/A  . Years of education: N/A   Social History Main Topics  . Smoking status: Former Smoker    Types: Cigarettes    Quit date: 03/17/1976  . Smokeless tobacco: Former Systems developer    Types:  Chew  . Alcohol use Yes     Comment: occasoinal beer  . Drug use: No  . Sexual activity: Not Asked   Other Topics Concern  . None   Social History Narrative  . None    His Allergies Are:  No Known Allergies:   His Current Medications Are:  Outpatient Encounter Prescriptions as of 04/28/2016  Medication Sig  . allopurinol (ZYLOPRIM) 300 MG tablet Take 1 tablet by mouth daily.  Marland Kitchen aspirin 81 MG tablet Take 81 mg by mouth daily.  . divalproex (DEPAKOTE) 500 MG DR tablet Take 500 mg by mouth 3 (three) times daily.  . dorzolamide-timolol (COSOPT) 22.3-6.8 MG/ML ophthalmic solution Place 1 drop into both eyes 2 (two) times daily.  . furosemide (LASIX) 20 MG tablet Take 20 mg by mouth daily.   Marland Kitchen gabapentin (NEURONTIN) 800 MG tablet Take 800 mg by mouth 3 (three) times daily.  Marland Kitchen latanoprost (XALATAN) 0.005 % ophthalmic solution Place 1 drop into both eyes daily.  Marland Kitchen levothyroxine (SYNTHROID, LEVOTHROID) 50 MCG tablet Take 1 tablet (50 mcg total) by mouth daily before breakfast.  . omeprazole (PRILOSEC) 40 MG capsule Take 1 capsule by mouth daily.  . primidone (MYSOLINE) 50 MG tablet Take 50 mg by mouth daily.  . propranolol (INDERAL) 10 MG tablet Take 1 tablet (10 mg total) by mouth 2 (two) times daily.  . tamsulosin (FLOMAX) 0.4 MG CAPS capsule Take 1 capsule by mouth daily.  . traMADol (ULTRAM) 50 MG tablet Take 1 tablet by mouth 2 (two) times daily.  Marland Kitchen venlafaxine XR (EFFEXOR-XR) 75 MG 24 hr capsule Take 3 capsules (225 mg total) by mouth daily.  Marland Kitchen venlafaxine XR (EFFEXOR-XR) 75 MG 24 hr capsule Take 3 capsules (225 mg total) by mouth daily.  . vitamin B-12 (CYANOCOBALAMIN) 500 MCG tablet Take 1 tablet (500 mcg total) by mouth daily.   No facility-administered encounter medications on file as of 04/28/2016.   :   Review of Systems:  Out of a complete 14 point review of systems, all are reviewed and negative with the exception of these symptoms as listed below:  Review of Systems   Musculoskeletal: Positive for arthralgias and myalgias.  Neurological: Positive for tremors, weakness and headaches.       Pt presents today to discuss his tremors. Pt notices the tremors in his bilateral hands and in his chin.    Objective:  Neurologic Exam  Physical Exam Physical Examination:   Vitals:   04/28/16 1444  BP: 94/67  Pulse: 63  Resp: 20   General Examination: The patient is a very pleasant 66 y.o. male in no acute distress. He appears well-developed and well-nourished and well groomed.   HEENT:  Normocephalic, atraumatic, pupils are equal, round and reactive to light and accommodation. Funduscopic exam is normal with sharp disc margins noted. Extraocular tracking is good without limitation to gaze excursion or nystagmus noted. Normal smooth pursuit is noted. Hearing is grossly intact. Face is symmetric with normal facial animation and normal facial sensation. Speech is clear with no dysarthria noted. There is no hypophonia. There is a mild lower lip and jaw tremor. No significant voice tremor is noted. Speech is mildly hypophonic. Neck is supple with full range of passive and active motion. There are no carotid bruits on auscultation. Oropharynx exam reveals: mild mouth dryness, adequate dental hygiene and mild airway crowding.Tongue protrudes centrally and palate elevates symmetrically.  Chest: Clear to auscultation without wheezing, rhonchi or crackles noted.  Heart: S1+S2+0, regular and normal without murmurs, rubs or gallops noted.   Abdomen: Soft, non-tender and non-distended with normal bowel sounds appreciated on auscultation.  Extremities: There is 1+ to 2+ pitting edema in the distal lower extremities bilaterally. Pedal pulses are intact.  Skin: Warm and dry without trophic changes noted. There are no varicose veins, chronic appearing discoloration both distal legs.   Musculoskeletal: exam reveals no obvious joint deformities, tenderness or joint swelling or  erythema.   Neurologically:  Mental status: The patient is awake, alert and oriented in all 4 spheres. His immediate and remote memory, attention, language skills and fund of knowledge are appropriate. There is no evidence of aphasia, agnosia, apraxia or anomia. Speech is clear with normal prosody and enunciation. Thought process is linear. Mood is normal and affect is normal.  Cranial nerves II - XII are as described above under HEENT exam. In addition: shoulder shrug is normal with equal shoulder height noted.  Motor exam: Normal to thing bulk, normal strength and tone in the UEs and 4/5 in the LEs, 4 minus out of 5 with foot dorsiflexion bilaterally, left worse than right.   On 04/28/2016: There is a slight intermittent resting tremor in the R hand. There is a bilateral upper extremity postural and action tremor, which is mild to moderate on the RUE and mild in degree on the L. There tremor frequency is fairly fast and the amplitude is small. On Archimedes spiral drawing there is moderate tremulousness noted on the L and severe on the R. Handwriting is severely tremulous, almost illlegible. There is no evidence of micrographia. Romberg is not testable safely. Reflexes are 2+ in the UEs, 1+ in the knees and trace in the ankles. Babinski: Toes are flexor bilaterally. Fine motor skills and coordination: intact with normal finger taps, normal hand movements, normal rapid alternating patting, normal foot taps and normal foot agility.  Cerebellar testing: No dysmetria or intention tremor on finger to nose testing. Heel to shin is not possible. There is no truncal or gait ataxia.  Sensory exam: intact to light touch, pinprick, vibration, temperature sense in the UEs and decreased to all modalities an proprioception in the upper and lower extremities.  Gait, station and balance: He stands with difficulty. No veering to one side is noted. No leaning to one side is noted. Posture is age-appropriate and stance is  narrow based. Gait shows mild limp on the left, walks with a single-point cane, does not pick up his feet as well. Almost drags his left foot. Tandem walk is not possible. Balance is impaired.  Assessment and Plan:   In summary, Bryan Hodges is a very pleasant 66 y.o.-year old male with an underlying complex medical history of  morbid obesity, status post gastric bypass surgery, bipolar disorder, eating disorder, glaucoma, history of DVTs, kidney stone, phlebitis, back injury, back pain, previously on narcotic pain medications, and overweight state, who presents for initial evaluation of his bilateral upper extremity tremor, findings and history most likely in keeping with essential tremor but no family history is reported, in addition, situation is complicated by his medications, he is taking at least 2 medications, namely generic Effexor and generic Depakote that are both known to exacerbate tremors and some patients. It may be difficult to tease out. He has evidence of neuropathy. I suggested we could proceed with EMG nerve conduction testing but he would like to hold off until he sees the neurosurgeon. I suggested we increase Mysoline gradually took 2 pills at night if possible. Increasing the beta blocker would be too risky to reduce his blood pressure further. He is advised to stay better hydrated with water. We talked about deep brain stimulation surgery and the potential for consultation for this. He is encouraged to think about it. I can make a referral in the near future. I adjusted his Mysoline prescription and suggested a 3 month follow-up, he can see one of our nurse practitioners at the time. I answered all her questions today and the patient and his wife were in agreement.  Thank you very much for allowing me to participate in the care of this nice patient. If I can be of any further assistance to you please do not hesitate to call me at [phone].  Sincerely,   Star Age, MD, PhD

## 2016-04-28 NOTE — Patient Instructions (Addendum)
You have a history and findings in keeping with Essential tremor.  You are taking some meds though, that can exacerbate tremors, including high dose Effexor and you are on Depakote for mood stabilization. You may benefit from seeing a psychiatrist.  For your tremor, continue with low dose propranolol, 10 mg 2 times a day.  Let's try increasing your Primidone 50 mg to 1 1/2 pills each bedtime for 2 weeks, then 2 pills at night thereafter. Common side effects reported are: Sleepiness, drowsiness, balance problems, confusion, and GI related symptoms.  As discussed, we can consider a referral for evaluation for deep brain stimulation (DBS) for treatment of advanced tremor/essential tremor.   Let's consider an EMG and nerve conduction velocity test, which is an electrical nerve and muscle test, which we will schedule. We will call you with the results.

## 2016-04-30 DIAGNOSIS — M542 Cervicalgia: Secondary | ICD-10-CM | POA: Diagnosis not present

## 2016-04-30 DIAGNOSIS — R29898 Other symptoms and signs involving the musculoskeletal system: Secondary | ICD-10-CM | POA: Diagnosis not present

## 2016-04-30 DIAGNOSIS — M4727 Other spondylosis with radiculopathy, lumbosacral region: Secondary | ICD-10-CM | POA: Diagnosis not present

## 2016-04-30 DIAGNOSIS — M549 Dorsalgia, unspecified: Secondary | ICD-10-CM | POA: Diagnosis not present

## 2016-05-02 ENCOUNTER — Ambulatory Visit: Payer: Medicare Other | Admitting: Internal Medicine

## 2016-05-20 ENCOUNTER — Telehealth: Payer: Self-pay | Admitting: Family Medicine

## 2016-05-20 NOTE — Telephone Encounter (Signed)
Caller name: Relationship to patient: Self Can be reached: [phone]  Pharmacy:  [facility], [place]. [phone] (Phone) [phone] (Fax)     Reason for call: Request a referral to an Allergist and also needs refill on Vitamin D

## 2016-05-21 ENCOUNTER — Other Ambulatory Visit: Payer: Self-pay | Admitting: Emergency Medicine

## 2016-05-21 ENCOUNTER — Telehealth: Payer: Self-pay | Admitting: Family Medicine

## 2016-05-21 DIAGNOSIS — J3089 Other allergic rhinitis: Secondary | ICD-10-CM

## 2016-05-21 DIAGNOSIS — M542 Cervicalgia: Secondary | ICD-10-CM | POA: Diagnosis not present

## 2016-05-21 DIAGNOSIS — M5134 Other intervertebral disc degeneration, thoracic region: Secondary | ICD-10-CM | POA: Diagnosis not present

## 2016-05-21 DIAGNOSIS — M549 Dorsalgia, unspecified: Secondary | ICD-10-CM | POA: Diagnosis not present

## 2016-05-21 DIAGNOSIS — Z9884 Bariatric surgery status: Secondary | ICD-10-CM

## 2016-05-21 DIAGNOSIS — E559 Vitamin D deficiency, unspecified: Secondary | ICD-10-CM

## 2016-05-21 MED ORDER — VITAMIN D (ERGOCALCIFEROL) 1.25 MG (50000 UNIT) PO CAPS: ORAL_CAPSULE | ORAL | 3 refills | Status: AC

## 2016-05-21 NOTE — Telephone Encounter (Signed)
Please give him a call- does he take the once weekly high dose vitamin D?   I will make referral to allergist- what are his main concerns for them to evaluate?  Thank you!

## 2016-05-21 NOTE — Telephone Encounter (Signed)
Called patient, left message for return call regarding  Vitamin D.

## 2016-05-21 NOTE — Telephone Encounter (Signed)
Called pt- he reports that he has been taking 50k of Vitamin D twice a week since he has his gastric bypass years ago.  Without this dose he will become quite deficient.  He also needs a referral to allergist for chronic nasal allergy sx- will take care of this for him

## 2016-06-04 NOTE — Telephone Encounter (Signed)
Patient states he takes Vitamin D 50K U twice weekly

## 2016-06-09 ENCOUNTER — Ambulatory Visit: Payer: Medicare Other | Admitting: Pediatrics

## 2016-06-16 ENCOUNTER — Other Ambulatory Visit: Payer: Self-pay | Admitting: Family Medicine

## 2016-06-16 ENCOUNTER — Telehealth: Payer: Self-pay | Admitting: Family Medicine

## 2016-06-16 NOTE — Telephone Encounter (Signed)
°  Relation to LT:JQZE Call back number: [phone] Pharmacy: [facility], [place]. [phone] (Phone) [phone] (Fax)     Reason for call:  Patient requesting a refill  tamsulosin (FLOMAX) 0.4 MG CAPS capsule  furosemide (LASIX) 20 MG tablet (swelling hasn't resolved)

## 2016-06-17 ENCOUNTER — Ambulatory Visit (INDEPENDENT_AMBULATORY_CARE_PROVIDER_SITE_OTHER): Payer: Medicare Other | Admitting: Pediatrics

## 2016-06-17 ENCOUNTER — Other Ambulatory Visit: Payer: Self-pay | Admitting: Emergency Medicine

## 2016-06-17 ENCOUNTER — Encounter: Payer: Self-pay | Admitting: Pediatrics

## 2016-06-17 VITALS — BP 90/64 | HR 60 | Temp 97.9°F | Resp 16 | Ht 70.5 in | Wt 213.0 lb

## 2016-06-17 DIAGNOSIS — Y832 Surgical operation with anastomosis, bypass or graft as the cause of abnormal reaction of the patient, or of later complication, without mention of misadventure at the time of the procedure: Secondary | ICD-10-CM

## 2016-06-17 DIAGNOSIS — J3089 Other allergic rhinitis: Secondary | ICD-10-CM | POA: Insufficient documentation

## 2016-06-17 DIAGNOSIS — M069 Rheumatoid arthritis, unspecified: Secondary | ICD-10-CM | POA: Diagnosis not present

## 2016-06-17 DIAGNOSIS — K9189 Other postprocedural complications and disorders of digestive system: Secondary | ICD-10-CM | POA: Diagnosis not present

## 2016-06-17 DIAGNOSIS — K219 Gastro-esophageal reflux disease without esophagitis: Secondary | ICD-10-CM

## 2016-06-17 DIAGNOSIS — Z79899 Other long term (current) drug therapy: Secondary | ICD-10-CM

## 2016-06-17 MED ORDER — FLUTICASONE PROPIONATE 50 MCG/ACT NA SUSP: 2.0000 | Freq: Every day | NASAL | 5 refills | Status: DC

## 2016-06-17 MED ORDER — FUROSEMIDE 20 MG PO TABS: 20.0000 mg | ORAL_TABLET | Freq: Every day | ORAL | 3 refills | Status: DC

## 2016-06-17 MED ORDER — VENLAFAXINE HCL ER 75 MG PO CP24: 225.0000 mg | ORAL_CAPSULE | Freq: Every day | ORAL | 3 refills | Status: DC

## 2016-06-17 MED ORDER — TAMSULOSIN HCL 0.4 MG PO CAPS: 0.4000 mg | ORAL_CAPSULE | Freq: Every day | ORAL | 3 refills | Status: DC

## 2016-06-17 NOTE — Patient Instructions (Signed)
Environmental controls dust and mold Nasal saline irrigations at night followed by fluticasone 2 sprays per nostril at night Chlor-Trimeton 12 mg Extentabs-take 1 tablet at night to help with the postnasal drainage Add prednisone 10 mg twice a day for 4 days, 10 mg on the fifth day to bring your allergic symptoms under control Continue on your other medications
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
## 2016-06-23 ENCOUNTER — Other Ambulatory Visit: Payer: Self-pay | Admitting: Family Medicine

## 2016-06-26 ENCOUNTER — Ambulatory Visit (INDEPENDENT_AMBULATORY_CARE_PROVIDER_SITE_OTHER): Payer: Medicare Other | Admitting: Family Medicine

## 2016-06-26 ENCOUNTER — Encounter: Payer: Self-pay | Admitting: Family Medicine

## 2016-06-26 VITALS — BP 104/64 | HR 66 | Temp 97.9°F | Ht 70.5 in | Wt 212.0 lb

## 2016-06-26 DIAGNOSIS — E875 Hyperkalemia: Secondary | ICD-10-CM | POA: Diagnosis not present

## 2016-06-26 DIAGNOSIS — Z5181 Encounter for therapeutic drug level monitoring: Secondary | ICD-10-CM | POA: Diagnosis not present

## 2016-06-26 DIAGNOSIS — Z1159 Encounter for screening for other viral diseases: Secondary | ICD-10-CM

## 2016-06-26 DIAGNOSIS — E039 Hypothyroidism, unspecified: Secondary | ICD-10-CM | POA: Diagnosis not present

## 2016-06-26 DIAGNOSIS — Z8639 Personal history of other endocrine, nutritional and metabolic disease: Secondary | ICD-10-CM

## 2016-06-26 LAB — HEPATITIS C ANTIBODY: HCV Ab: NEGATIVE

## 2016-06-26 NOTE — Patient Instructions (Addendum)
It was a pleasure to see you today- take care and I will be in touch with your labs asap  Please see me in about 4 months
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
## 2016-06-27 ENCOUNTER — Encounter: Payer: Self-pay | Admitting: Family Medicine

## 2016-06-27 LAB — BASIC METABOLIC PANEL
BUN: 23 mg/dL (ref 6–23)
CALCIUM: 8.8 mg/dL (ref 8.4–10.5)
CHLORIDE: 108 meq/L (ref 96–112)
CO2: 28 meq/L (ref 19–32)
CREATININE: 1.05 mg/dL (ref 0.40–1.50)
GFR: 75.69 mL/min (ref >60.00)
GLUCOSE: 88 mg/dL (ref 70–99)
Potassium: 5.7 mEq/L — ABNORMAL HIGH (ref 3.5–5.1)
Sodium: 142 mEq/L (ref 135–145)

## 2016-06-27 LAB — VITAMIN D 25 HYDROXY (VIT D DEFICIENCY, FRACTURES): VITD: 74.61 ng/mL (ref 30.00–100.00)

## 2016-06-27 LAB — TSH: TSH: 2.42 u[IU]/mL (ref 0.35–4.50)

## 2016-06-27 NOTE — Addendum Note (Signed)
Addended by: Lamar Blinks C on: 06/27/2016 01:52 PM   Modules accepted: Orders

## 2016-07-28 ENCOUNTER — Ambulatory Visit: Payer: Medicare Other | Admitting: Adult Health

## 2016-09-09 ENCOUNTER — Ambulatory Visit (INDEPENDENT_AMBULATORY_CARE_PROVIDER_SITE_OTHER): Payer: Medicare Other | Admitting: Adult Health

## 2016-09-09 ENCOUNTER — Encounter: Payer: Self-pay | Admitting: Adult Health

## 2016-09-09 VITALS — BP 114/79 | HR 66 | Ht 68.0 in | Wt 208.0 lb

## 2016-09-09 DIAGNOSIS — G250 Essential tremor: Secondary | ICD-10-CM

## 2016-09-09 NOTE — Patient Instructions (Addendum)
Your Plan: Look at [place] prescription and tell us how you are taking. If you have increased dosage- I want you to consider Deep Brain Stimulator.   Thank you for coming to see [place] at [facility]. I hope we have been able to provide you high quality care today.  You may receive a patient satisfaction survey over the next few weeks. We would appreciate your feedback and comments so that we may continue to improve ourselves and the health of our patients.

## 2016-09-09 NOTE — Progress Notes (Signed)
PATIENT: Bryan Hodges DOB: 1951-01-24  REASON FOR VISIT: follow up- essential tremor HISTORY FROM: patient  HISTORY OF PRESENT ILLNESS: Bryan Hodges is a 66 year old male with a history of essential tremor. He returns today for follow-up. At the last visit his Mysoline was increased to 2 tablets at bedtime. The patient's wife manages his medication. She states that she is unsure if they increased medication but she "thinks they have." The patient reports no improvement in tremor. He feels that it is worse. He reports the tremor only occurs in the hands typically when he is trying to do something. He reports that if he's trying to hammer a nail his tremor is worse. The patient is also on low-dose propranolol. He is unable to tolerate higher dosages due to his blood pressure. The patient has researched deep brain stimulator. He may be interested in more information regarding this. The patient also reports that he's been having pain at the base of the skull. He reports a previous doctor gave him an injection however he does not recall what this was. He also has noticed some memory trouble. He reports that he has not discussed either of these things with his primary care provider. He returns today for an evaluation.  HISTORY Copied from Dr. Guadelupe Sabin notes 04/28/16: Bryan Hodges is a 66 year old right-handed gentleman with an underlying complex medical history of morbid obesity, status post gastric bypass surgery, bipolar disorder, eating disorder, glaucoma, history of DVTs, kidney stone, phlebitis, back injury, back pain, previously on narcotic pain medications, and overweight state, who reports a longstanding history of bilateral upper extremity tremor of maybe 3-5 years duration. He saw his primary care physician when he still was in [place] and was started on one medication and when they moved to [place] a second medication was added and he saw a neurologist one time. He has a history of low blood pressure and  therefore he is just on a low-dose of propranolol at this time, 10 mg twice daily and he has been on Mysoline 50 mg each evening. He has no family history of Parkinson's disease or tremors. He had 3 brothers and 3 sisters, now only 2 sisters are alive, father had multiple sclerosis but died of heart disease at the age 42. Patient has a history of bipolar disorder and has been on high-dose venlafaxine for about 3 years, has been on Depakote longer him a maybe 3-5 years. His tremor is worse on the right side. He has ongoing issues with chronic pain. He is no longer on narcotic pain medication. Has a history of nerve damage. Had EMG and nerve conduction testing in the past and was told he had nerve damage from the back. He had a L spine MRI on 04/05/16, which I reviewed: IMPRESSION: Curvature convex to the right with the apex at L2-3.  Degenerative disc disease and degenerative facet disease that could be associated with back pain. No clear compressive stenosis is identified. There is mild narrowing of left lateral recess at L2-3, bilateral lateral recesses at L3-4 and of the intervertebral foramen on the right at L5-S1, but definite neural compression is not demonstrated in these locations.  He has an appointment with NSG next week.   He is on disability. He has 5 children. He drinks 1 beer at night, likes to drink sweet tea, typically no sodas, quit smoking in 1978.  REVIEW OF SYSTEMS: Out of a complete 14 system review of symptoms, the patient complains only of the following symptoms,  and all other reviewed systems are negative.  Fatigue, ringing in ears, runny nose, leg swelling, eye itching, light sensitivity, memory loss, dizziness, headache, numbness, tremors, agitation, confusion, decreased concentration, depression, nervous/anxious, joint pain, back pain, aching muscles, muscle cramps, walking difficulty, neck pain, neck stiffness  ALLERGIES: No Known Allergies  HOME  MEDICATIONS: Outpatient Medications Prior to Visit  Medication Sig Dispense Refill  . allopurinol (ZYLOPRIM) 300 MG tablet Take 1 tablet by mouth daily.    Marland Kitchen aspirin 81 MG tablet Take 81 mg by mouth daily.    . divalproex (DEPAKOTE) 500 MG DR tablet Take 500 mg by mouth 3 (three) times daily.    . dorzolamide-timolol (COSOPT) 22.3-6.8 MG/ML ophthalmic solution Place 1 drop into both eyes 2 (two) times daily.    . fluticasone (FLONASE) 50 MCG/ACT nasal spray Place 2 sprays into both nostrils at bedtime. 16 g 5  . furosemide (LASIX) 20 MG tablet Take 1 tablet (20 mg total) by mouth daily. 90 tablet 3  . gabapentin (NEURONTIN) 800 MG tablet Take 800 mg by mouth 3 (three) times daily.    Marland Kitchen latanoprost (XALATAN) 0.005 % ophthalmic solution Place 1 drop into both eyes daily.    Marland Kitchen levothyroxine (SYNTHROID, LEVOTHROID) 50 MCG tablet TAKE ONE TABLET BY MOUTH ONCE DAILY BEFORE BREAKFAST 30 tablet 3  . omeprazole (PRILOSEC) 40 MG capsule Take 1 capsule by mouth daily.    . primidone (MYSOLINE) 50 MG tablet Take 1 1/2 pills each night for 2 weeks, then 2 pills each bedtime thereafter. 60 tablet 5  . propranolol (INDERAL) 10 MG tablet TAKE ONE TABLET BY MOUTH TWICE DAILY 60 tablet 3  . tamsulosin (FLOMAX) 0.4 MG CAPS capsule Take 1 capsule (0.4 mg total) by mouth daily. 90 capsule 3  . traMADol (ULTRAM) 50 MG tablet Take 1 tablet by mouth 2 (two) times daily.    Marland Kitchen venlafaxine XR (EFFEXOR-XR) 75 MG 24 hr capsule Take 3 capsules (225 mg total) by mouth daily. 270 capsule 3  . vitamin B-12 (CYANOCOBALAMIN) 500 MCG tablet Take 1 tablet (500 mcg total) by mouth daily. 30 tablet 3  . Vitamin D, Ergocalciferol, (DRISDOL) 50000 units CAPS capsule Take 2x a week 30 capsule 3   No facility-administered medications prior to visit.     PAST MEDICAL HISTORY: Past Medical History:  Diagnosis Date  . Depression   . Eating disorder   . Elevated blood pressure reading   . Frequent headaches   . Glaucoma   . H/O  blood clots   . Hay fever   . Kidney stone   . Phlebitis     PAST SURGICAL HISTORY: Past Surgical History:  Procedure Laterality Date  . APPENDECTOMY  2000  . CHOLECYSTECTOMY  2000  . GASTRIC BYPASS  2000  . GASTRIC BYPASS  2003  . OTHER SURGICAL HISTORY  2001   Reconstructive surgery   . SHOULDER SURGERY      FAMILY HISTORY: Family History  Problem Relation Age of Onset  . Arthritis Mother   . Hyperlipidemia Mother   . Heart disease Mother   . Hypertension Mother   . Alcohol abuse Mother   . Heart attack Mother   . Hyperlipidemia Father   . Heart disease Father   . Hypertension Father   . Alcohol abuse Sister   . Other Sister        Brain tumor  . Alcohol abuse Brother   . Other Brother        Brain tumor  .  Ovarian cancer Paternal Aunt   . Diabetes Paternal Aunt   . Stroke Paternal Grandmother   . Asthma Neg Hx   . Eczema Neg Hx   . Urticaria Neg Hx   . Immunodeficiency Neg Hx   . Angioedema Neg Hx   . Allergic rhinitis Neg Hx     SOCIAL HISTORY: Social History   Social History  . Marital status: Married    Spouse name: N/A  . Number of children: N/A  . Years of education: N/A   Occupational History  . Not on file.   Social History Main Topics  . Smoking status: Former Smoker    Types: Cigarettes    Quit date: 03/17/1976  . Smokeless tobacco: Former Systems developer    Types: Chew  . Alcohol use Yes     Comment: occasoinal beer  . Drug use: No  . Sexual activity: Not on file   Other Topics Concern  . Not on file   Social History Narrative  . No narrative on file      PHYSICAL EXAM  Vitals:   09/09/16 1429  BP: 114/79  Pulse: 66  Weight: 208 lb (94.3 kg)  Height: 5\' 8"  (1.727 m)   Body mass index is 31.63 kg/m.  Generalized: Well developed, in no acute distress   Neurological examination  Mentation: Alert oriented to time, place, history taking. Follows all commands speech and language fluent Cranial nerve II-XII: Pupils were equal  round reactive to light. Extraocular movements were full, visual field were full on confrontational test. Facial sensation and strength were normal. Uvula tongue midline. Head turning and shoulder shrug  were normal and symmetric. Motor: The motor testing reveals 5 over 5 strength of all 4 extremities. Good symmetric motor tone is noted throughout. Contingent tremor noted in both hands. Sensory: Sensory testing is intact to soft touch on all 4 extremities. No evidence of extinction is noted.  Coordination: Cerebellar testing reveals good finger-nose-finger and heel-to-shin bilaterally. Intention tremor noted in both hands Gait and station: Patient uses a cane when ambulating. Tandem gait not attempted. Reflexes: Deep tendon reflexes are symmetric and normal bilaterally.   DIAGNOSTIC DATA (LABS, IMAGING, TESTING) - I reviewed patient records, labs, notes, testing and imaging myself where available.  Lab Results  Component Value Date   WBC 3.1 (L) 12/24/2015   HGB 13.0 12/24/2015   HCT 37.9 (L) 12/24/2015   MCV 92.7 12/24/2015   PLT 116 (L) 12/24/2015      Component Value Date/Time   NA 142 06/26/2016 1433   K 5.7 (H) 06/26/2016 1433   CL 108 06/26/2016 1433   CO2 28 06/26/2016 1433   GLUCOSE 88 06/26/2016 1433   BUN 23 06/26/2016 1433   CREATININE 1.05 06/26/2016 1433   CALCIUM 8.8 06/26/2016 1433   PROT 6.0 (L) 12/24/2015 1930   ALBUMIN 3.3 (L) 12/24/2015 1930   AST 25 12/24/2015 1930   ALT 15 (L) 12/24/2015 1930   ALKPHOS 70 12/24/2015 1930   BILITOT 0.5 12/24/2015 1930   GFRNONAA >60 12/24/2015 1930   GFRAA >60 12/24/2015 1930   Lab Results  Component Value Date   CHOL 134 11/14/2015   HDL 57.20 11/14/2015   LDLCALC 60 11/14/2015   TRIG 83.0 11/14/2015   CHOLHDL 2 11/14/2015   Lab Results  Component Value Date   HGBA1C 5.2 11/14/2015   No results found for: [id number] Lab Results  Component Value Date   TSH 2.42 06/26/2016      ASSESSMENT AND  PLAN 66 y.o.  year old male  has a past medical history of Depression; Eating disorder; Elevated blood pressure reading; Frequent headaches; Glaucoma; H/O blood clots; Hay fever; Kidney stone; and Phlebitis. here with:  1. Essential tremor  I advised the patient and his wife checked his medication. He should ensure that he states in Mysoline 50 mg 2 tablets at bedtime. The patient would also like to be referred to discuss potential for deep brain stimulator. He states that he prefers to see someone locally. I will refer to Wells Guiles Tat for consultation. Patient is amenable to this plan. He will follow-up in 6 months or sooner if needed. The patient plans to discuss new symptoms with his primary care provider.  I spent 15 minutes with the patient. 50% of this time was spent discussing treatment options with patient.      Ward Givens, MSN, NP-C 09/09/2016, 2:45 PM [facility] [address], [place], [place] [address] [phone]

## 2016-09-10 NOTE — Progress Notes (Signed)
I agree with the assessment and plan as directed by NP .The patient is known to me .   Bryan Tercero, MD  

## 2016-10-02 ENCOUNTER — Other Ambulatory Visit: Payer: Self-pay | Admitting: Emergency Medicine

## 2016-10-02 MED ORDER — GABAPENTIN 800 MG PO TABS: 800.0000 mg | ORAL_TABLET | Freq: Three times a day (TID) | ORAL | 1 refills | Status: DC

## 2016-10-05 NOTE — Progress Notes (Deleted)
Subjective:   Bryan Hodges was seen in consultation in the movement disorder clinic at the request of Ward Givens.  His PCP is  Copland, Gay Filler, MD.  The evaluation is for possible DBS for essential tremor. This patient is accompanied in the office by his {companion:[id number]} who supplements the history.  The records that were made available to me were reviewed.   Tremor started approximately *** ago and involves the ***.  Tremor is most noticeable when ***.   There is *** family hx of tremor.  Pt is on propranolol, 10 mg bid (unable to tolerate higher dosages due to drops in BP).  He is also on primidone, 50 mg, 2 po q hs.  He is on gabapentin, 800 mg tid but that is not specifically for tremor but for ***.  Pt is on Depakote, 1500 mg daily and reports that he has been on that for ***.  Affected by caffeine:  {yes no:314532} Affected by alcohol:  {yes no:314532} Affected by stress:  {yes no:314532} Affected by fatigue:  {yes no:314532} Spills soup if on spoon:  {yes no:314532} Spills glass of liquid if full:  {yes no:314532} Affects ADL's (tying shoes, brushing teeth, etc):  {yes no:314532}  Current/Previously tried tremor medications: ***on propranolol (unable to tolerate higher dosages due to drops in BP); primidone; on gabapentin 800 mg tid  Current medications that may exacerbate tremor:  ***Depakote  Outside reports reviewed: {Outside review:[id number]}.  Neuroimaging of the brain has not been performed.    No Known Allergies  Outpatient Encounter Prescriptions as of 10/07/2016  Medication Sig  . allopurinol (ZYLOPRIM) 300 MG tablet Take 1 tablet by mouth daily.  Marland Kitchen aspirin 81 MG tablet Take 81 mg by mouth daily.  . divalproex (DEPAKOTE) 500 MG DR tablet Take 500 mg by mouth 3 (three) times daily.  . dorzolamide-timolol (COSOPT) 22.3-6.8 MG/ML ophthalmic solution Place 1 drop into both eyes 2 (two) times daily.  . fluticasone (FLONASE) 50 MCG/ACT nasal spray Place 2 sprays into both  nostrils at bedtime.  . furosemide (LASIX) 20 MG tablet Take 1 tablet (20 mg total) by mouth daily.  Marland Kitchen gabapentin (NEURONTIN) 800 MG tablet Take 1 tablet (800 mg total) by mouth 3 (three) times daily.  Marland Kitchen latanoprost (XALATAN) 0.005 % ophthalmic solution Place 1 drop into both eyes daily.  Marland Kitchen levothyroxine (SYNTHROID, LEVOTHROID) 50 MCG tablet TAKE ONE TABLET BY MOUTH ONCE DAILY BEFORE BREAKFAST  . omeprazole (PRILOSEC) 40 MG capsule Take 1 capsule by mouth daily.  . primidone (MYSOLINE) 50 MG tablet Take 1 1/2 pills each night for 2 weeks, then 2 pills each bedtime thereafter.  . propranolol (INDERAL) 10 MG tablet TAKE ONE TABLET BY MOUTH TWICE DAILY  . tamsulosin (FLOMAX) 0.4 MG CAPS capsule Take 1 capsule (0.4 mg total) by mouth daily.  . traMADol (ULTRAM) 50 MG tablet Take 1 tablet by mouth 2 (two) times daily.  Marland Kitchen venlafaxine XR (EFFEXOR-XR) 75 MG 24 hr capsule Take 3 capsules (225 mg total) by mouth daily.  . vitamin B-12 (CYANOCOBALAMIN) 500 MCG tablet Take 1 tablet (500 mcg total) by mouth daily.  . Vitamin D, Ergocalciferol, (DRISDOL) 50000 units CAPS capsule Take 2x a week   No facility-administered encounter medications on file as of 10/07/2016.     Past Medical History:  Diagnosis Date  . Depression   . Eating disorder   . Elevated blood pressure reading   . Frequent headaches   . Glaucoma   . H/O blood clots   .  Hay fever   . Kidney stone   . Phlebitis     Past Surgical History:  Procedure Laterality Date  . APPENDECTOMY  2000  . CHOLECYSTECTOMY  2000  . GASTRIC BYPASS  2000  . GASTRIC BYPASS  2003  . OTHER SURGICAL HISTORY  2001   Reconstructive surgery   . SHOULDER SURGERY      Social History   Social History  . Marital status: Married    Spouse name: N/A  . Number of children: N/A  . Years of education: N/A   Occupational History  . Not on file.   Social History Main Topics  . Smoking status: Former Smoker    Types: Cigarettes    Quit date: 03/17/1976    . Smokeless tobacco: Former Systems developer    Types: Chew  . Alcohol use Yes     Comment: occasoinal beer  . Drug use: No  . Sexual activity: Not on file   Other Topics Concern  . Not on file   Social History Narrative  . No narrative on file    Family Status  Relation Status  . Mother (Not Specified)  . Father (Not Specified)  . Sister (Not Specified)  . Brother (Not Specified)  . Ethlyn Daniels (Not Specified)  . PGM (Not Specified)  . Neg Hx (Not Specified)    Review of Systems A complete 10 system ROS was obtained and was negative apart from what is mentioned.   Objective:   VITALS:  There were no vitals filed for this visit. Gen:  Appears stated age and in NAD. HEENT:  Normocephalic, atraumatic. The mucous membranes are moist. The superficial temporal arteries are without ropiness or tenderness. Cardiovascular: Regular rate and rhythm. Lungs: Clear to auscultation bilaterally. Neck: There are no carotid bruits noted bilaterally.  NEUROLOGICAL:  Orientation:  The patient is alert and oriented x 3.  Recent and remote memory are intact.  Attention span and concentration are normal.  Able to name objects and repeat without trouble.  Fund of knowledge is appropriate Cranial nerves: There is good facial symmetry. The pupils are equal round and reactive to light bilaterally. Fundoscopic exam reveals clear disc margins bilaterally. Extraocular muscles are intact and visual fields are full to confrontational testing. Speech is fluent and clear. Soft palate rises symmetrically and there is no tongue deviation. Hearing is intact to conversational tone. Tone: Tone is good throughout. Sensation: Sensation is intact to light touch and pinprick throughout (facial, trunk, extremities). Vibration is intact at the bilateral big toe. There is no extinction with double simultaneous stimulation. There is no sensory dermatomal level identified. Coordination:  The patient has no dysdiadichokinesia or  dysmetria. Motor: Strength is 5/5 in the bilateral upper and lower extremities.  Shoulder shrug is equal bilaterally.  There is no pronator drift.  There are no fasciculations noted. DTR's: Deep tendon reflexes are 2/4 at the bilateral biceps, triceps, brachioradialis, patella and achilles.  Plantar responses are downgoing bilaterally. Gait and Station: The patient is able to ambulate without difficulty. The patient is able to heel toe walk without any difficulty. The patient is able to ambulate in a tandem fashion. The patient is able to stand in the Romberg position.   MOVEMENT EXAM: Tremor:  There is *** tremor in the UE, noted most significantly with action.  The patient is *** able to draw Archimedes spirals without significant difficulty.  There is *** tremor at rest.  The patient is *** able to pour water from one  glass to another without spilling it.  Labs:  Lab Results  Component Value Date   TSH 2.42 06/26/2016     Chemistry      Component Value Date/Time   NA 142 06/26/2016 1433   K 5.7 (H) 06/26/2016 1433   CL 108 06/26/2016 1433   CO2 28 06/26/2016 1433   BUN 23 06/26/2016 1433   CREATININE 1.05 06/26/2016 1433      Component Value Date/Time   CALCIUM 8.8 06/26/2016 1433   ALKPHOS 70 12/24/2015 1930   AST 25 12/24/2015 1930   ALT 15 (L) 12/24/2015 1930   BILITOT 0.5 12/24/2015 1930     No results found for: VITAMINB12      Assessment/Plan:   1.  Essential Tremor.  -This is evidenced by the symmetrical nature and longstanding hx of gradually getting worse.  We discussed nature and pathophysiology.  We discussed that this can continue to gradually get worse with time.  We discussed that some medications can worsen this, as can caffeine use.  We discussed medication therapy as well as surgical therapy.  Ultimately, the patient decided to ***.    CC:  Copland, Gay Filler, MD

## 2016-10-07 ENCOUNTER — Ambulatory Visit: Payer: Medicare Other | Admitting: Neurology

## 2016-10-13 ENCOUNTER — Other Ambulatory Visit: Payer: Self-pay | Admitting: Family Medicine

## 2016-10-13 NOTE — Telephone Encounter (Signed)
Rx approved and sent to the pharmacy by e-script.//AB/CMA 

## 2016-10-30 NOTE — Progress Notes (Signed)
Subjective:   Bryan Hodges was seen in consultation in the movement disorder clinic at the request of Ward Givens.  His PCP is  Copland, Gay Filler, MD.  The evaluation is for possible DBS for essential tremor.   The records that were made available to me were reviewed.   Tremor started approximately 2+ years ago and involves the bilateral UE, R more than L.  He feels it in his face and tongue but it is worse in the hands.  Pt states that tremor was there before that but he was on "heavy duty" pain medication.  He then "detoxed" from the pain medication and he noted more tremor.  Tremor is most noticeable when he uses the hands.   There is no family hx of tremor.  Pt is on propranolol, 10 mg bid (unable to tolerate higher dosages due to drops in BP).  He is also on primidone, 50 mg, 2 po q hs.  He is on gabapentin, 800 mg tid but that is not specifically for tremor but for back pain.  Pt is on Depakote, 1500 mg daily and reports that he has been on that for mood.  He was told in the past that could be causing the tremor.  He was wanting to stop that medication but he was told that in order to stop it he would need psychiatric consult and he states that he could not afford it.    Affected by caffeine:  Doesn't drink any (doesn't drink caffeine) Affected by alcohol:  No. (drinks 1 beer per night) Affected by stress:  Yes.  , he thinks so but isn't sure Affected by fatigue:  No. Spills soup if on spoon:  No. Spills glass of liquid if full:  No. Affects ADL's: some trouble shaving; no longer able to tie shoes and uses velcro shoes  Current/Previously tried tremor medications: on propranolol (unable to tolerate higher dosages due to drops in BP); primidone; on gabapentin 800 mg tid  Current medications that may exacerbate tremor:  Depakote  Outside reports reviewed: historical medical records, lab reports, office notes and radiology reports.  Neuroimaging of the brain has been performed per patient  but I have no copies of that.  States that he had an MRI brain within our system less than 6 months ago.  I have pulled up all images via [facility] and see no brain imaging.  No Known Allergies  Outpatient Encounter Prescriptions as of 10/31/2016  Medication Sig  . allopurinol (ZYLOPRIM) 300 MG tablet Take 1 tablet by mouth daily.  Marland Kitchen aspirin 81 MG tablet Take 81 mg by mouth daily.  . divalproex (DEPAKOTE) 500 MG DR tablet Take 500 mg by mouth 3 (three) times daily.  . dorzolamide-timolol (COSOPT) 22.3-6.8 MG/ML ophthalmic solution Place 1 drop into both eyes 2 (two) times daily.  . fluticasone (FLONASE) 50 MCG/ACT nasal spray Place 2 sprays into both nostrils at bedtime.  . furosemide (LASIX) 20 MG tablet Take 1 tablet (20 mg total) by mouth daily.  Marland Kitchen gabapentin (NEURONTIN) 800 MG tablet Take 1 tablet (800 mg total) by mouth 3 (three) times daily.  Marland Kitchen latanoprost (XALATAN) 0.005 % ophthalmic solution Place 1 drop into both eyes daily.  Marland Kitchen levothyroxine (SYNTHROID, LEVOTHROID) 50 MCG tablet TAKE ONE TABLET BY MOUTH ONCE DAILY BEFORE BREAKFAST  . omeprazole (PRILOSEC) 40 MG capsule Take 1 capsule by mouth daily.  . primidone (MYSOLINE) 50 MG tablet Take 1 1/2 pills each night for 2 weeks, then 2 pills  each bedtime thereafter.  . propranolol (INDERAL) 10 MG tablet TAKE 1 TABLET BY MOUTH TWICE DAILY  . tamsulosin (FLOMAX) 0.4 MG CAPS capsule Take 1 capsule (0.4 mg total) by mouth daily.  . traMADol (ULTRAM) 50 MG tablet Take 1 tablet by mouth 2 (two) times daily.  Marland Kitchen venlafaxine XR (EFFEXOR-XR) 75 MG 24 hr capsule Take 3 capsules (225 mg total) by mouth daily.  . vitamin B-12 (CYANOCOBALAMIN) 500 MCG tablet Take 1 tablet (500 mcg total) by mouth daily.  . Vitamin D, Ergocalciferol, (DRISDOL) 50000 units CAPS capsule Take 2x a week   No facility-administered encounter medications on file as of 10/31/2016.     Past Medical History:  Diagnosis Date  . Depression   . Eating disorder   . Elevated blood  pressure reading   . Frequent headaches   . Glaucoma   . H/O blood clots    associated with an injury per patient  . Hay fever   . Kidney stone   . Phlebitis     Past Surgical History:  Procedure Laterality Date  . APPENDECTOMY  2000  . CHOLECYSTECTOMY  2000  . GASTRIC BYPASS  2000  . GASTRIC BYPASS  2003  . OTHER SURGICAL HISTORY  2001   Reconstructive surgery   . SHOULDER SURGERY      Social History   Social History  . Marital status: Married    Spouse name: N/A  . Number of children: N/A  . Years of education: N/A   Occupational History  . Not on file.   Social History Main Topics  . Smoking status: Former Smoker    Types: Cigarettes    Quit date: 03/17/1976  . Smokeless tobacco: Former Systems developer    Types: Chew  . Alcohol use Yes     Comment: occasional beer  . Drug use: No  . Sexual activity: Not on file   Other Topics Concern  . Not on file   Social History Narrative  . No narrative on file    Family Status  Relation Status  . Mother (Not Specified)  . Father (Not Specified)  . Sister (Not Specified)  . Brother (Not Specified)  . Ethlyn Daniels (Not Specified)  . PGM (Not Specified)  . Neg Hx (Not Specified)    Review of Systems A complete 10 system ROS was obtained and was negative apart from what is mentioned.   Objective:   VITALS:   Vitals:   10/31/16 1335  BP: 140/80  Pulse: 64  SpO2: 97%  Weight: 208 lb (94.3 kg)  Height: 5\' 10"  (1.778 m)   Gen:  Appears stated age and in NAD. HEENT:  Normocephalic, atraumatic. The mucous membranes are moist. The superficial temporal arteries are without ropiness or tenderness. Cardiovascular: Regular rate and rhythm. Lungs: Clear to auscultation bilaterally. Neck: There are no carotid bruits noted bilaterally.  NEUROLOGICAL:  Orientation:  The patient is alert and oriented x 3.  Recent and remote memory are intact. He has poor attention and concentration span.  He has poor ability to stay on topic.  He  is unfocused.  Able to name objects and repeat without trouble.  Fund of knowledge is appropriate Cranial nerves: There is good facial symmetry with the exception of some asymmetric blink on the left. The pupils are equal round and reactive to light bilaterally. Fundoscopic exam reveals clear disc margins bilaterally. Extraocular muscles are intact and visual fields are full to confrontational testing. Speech is fluent and clear. Soft palate  rises symmetrically and there is no tongue deviation. Hearing is intact to conversational tone. Tone: Tone is good throughout. Sensation: Sensation is intact to light touch and pinprick throughout (facial, trunk, extremities). Vibration is absent at the bilateral big toe and ankle. There is no extinction with double simultaneous stimulation. There is no sensory dermatomal level identified. Coordination:  The patient has no dysdiadichokinesia or dysmetria. Motor: Strength is 5/5 in the RUE/LUE.  There is decreased strength in finger abductors on the left.  Strength is at least 4+/5 in the LLE but there is give way weakness that improves with encouragment.  Strength is at least 5-/5 in the RLE that improves with encouragment.  Grip strength is decreased on the L compared to the right.   There is no pronator drift.  There are no fasciculations noted. DTR's: Deep tendon reflexes are 2-/4 at the bilateral biceps, triceps, brachioradialis, patella and achilles.  Plantar responses are downgoing bilaterally. Gait and Station: The patient is ambulatory with a stooped posture and antalgic gait.  He somewhat drags the right leg.  He ambulates with a cane.   MOVEMENT EXAM: Tremor:  There is an irregular tremor in the UE, noted most significantly with action.  When asked to tap a beat with one hand, tremor frequency matches that beat on the other side.  He has some difficulty with Archimedes spirals, more so on the right than the left.  Tremor is evident when he pours water from  one glass to another, but he does not spill the water.    Labs:  Lab Results  Component Value Date   TSH 2.42 06/26/2016     Chemistry      Component Value Date/Time   NA 142 06/26/2016 1433   K 5.7 (H) 06/26/2016 1433   CL 108 06/26/2016 1433   CO2 28 06/26/2016 1433   BUN 23 06/26/2016 1433   CREATININE 1.05 06/26/2016 1433      Component Value Date/Time   CALCIUM 8.8 06/26/2016 1433   ALKPHOS 70 12/24/2015 1930   AST 25 12/24/2015 1930   ALT 15 (L) 12/24/2015 1930   BILITOT 0.5 12/24/2015 1930     No results found for: VITAMINB12      Assessment/Plan:   1.  Tremor.  -I explained to the patient that he would not be a DBS candidate at this time.  The primary reason is because he is on a tremor provoking medication, primarily Depakote.  I did tell him that I would not want him to come off this medication without the approval of his prescribing physician.  -Even if the patient were to come off of his Depakote, I have concerns that he may not be a DBS candidate.  He would need psychologic testing, as would all of our patient, to make sure that he is adequately fit for surgery.  In addition, he reports that he has had significant infectious complications with prior surgeries and this needs to be taken into consideration.  Finally, I told him that he really is on a fairly small dose of primidone and this needs to be optimized before considering surgery further.  He agreed and really did not want to pursue surgery without going through these other avenues.  He has a follow-up appointment at [facility] neurology and plans to talk further to [facility] and Dr. Rexene Alberts about these issues.  He will follow up here on an as needed basis if surgery needs to be considered in the future.  Much  greater than 50% of this visit was spent in counseling and coordinating care.  Total face to face time:  60 min    CC:  Copland, Gay Filler, MD

## 2016-10-31 ENCOUNTER — Ambulatory Visit (INDEPENDENT_AMBULATORY_CARE_PROVIDER_SITE_OTHER): Payer: Medicare Other | Admitting: Neurology

## 2016-10-31 ENCOUNTER — Encounter: Payer: Self-pay | Admitting: Neurology

## 2016-10-31 VITALS — BP 140/80 | HR 64 | Ht 70.0 in | Wt 208.0 lb

## 2016-10-31 DIAGNOSIS — G250 Essential tremor: Secondary | ICD-10-CM | POA: Diagnosis not present
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
## 2016-11-03 ENCOUNTER — Ambulatory Visit (INDEPENDENT_AMBULATORY_CARE_PROVIDER_SITE_OTHER): Payer: Medicare Other | Admitting: Family Medicine

## 2016-11-03 ENCOUNTER — Encounter: Payer: Self-pay | Admitting: Family Medicine

## 2016-11-03 VITALS — BP 100/70 | HR 75 | Temp 98.0°F | Ht 70.0 in | Wt 209.2 lb

## 2016-11-03 DIAGNOSIS — Z5181 Encounter for therapeutic drug level monitoring: Secondary | ICD-10-CM

## 2016-11-03 DIAGNOSIS — Z9884 Bariatric surgery status: Secondary | ICD-10-CM

## 2016-11-03 DIAGNOSIS — E039 Hypothyroidism, unspecified: Secondary | ICD-10-CM | POA: Diagnosis not present

## 2016-11-03 DIAGNOSIS — Z8639 Personal history of other endocrine, nutritional and metabolic disease: Secondary | ICD-10-CM | POA: Diagnosis not present

## 2016-11-03 DIAGNOSIS — R251 Tremor, unspecified: Secondary | ICD-10-CM | POA: Diagnosis not present

## 2016-11-03 NOTE — Patient Instructions (Signed)
You can stop using the propranolol since it is not helping with your tremor.   I will reach out to your original neurologist about your depakote and see if we can just try tapering you off this gradually

## 2016-11-05 ENCOUNTER — Other Ambulatory Visit: Payer: Self-pay | Admitting: Family Medicine

## 2016-11-19 DIAGNOSIS — D3131 Benign neoplasm of right choroid: Secondary | ICD-10-CM | POA: Diagnosis not present

## 2016-11-19 DIAGNOSIS — H401134 Primary open-angle glaucoma, bilateral, indeterminate stage: Secondary | ICD-10-CM | POA: Diagnosis not present

## 2016-11-19 DIAGNOSIS — H26493 Other secondary cataract, bilateral: Secondary | ICD-10-CM | POA: Diagnosis not present

## 2016-11-19 DIAGNOSIS — Z9889 Other specified postprocedural states: Secondary | ICD-10-CM | POA: Diagnosis not present

## 2016-11-19 DIAGNOSIS — Z961 Presence of intraocular lens: Secondary | ICD-10-CM | POA: Diagnosis not present

## 2016-11-26 ENCOUNTER — Telehealth: Payer: Self-pay | Admitting: Family Medicine

## 2016-11-26 NOTE — Telephone Encounter (Signed)
-----   Message from Star Age, MD sent at 11/05/2016  7:24 AM EDT ----- Hello,  You can certainly try and taper his Depakote, but if he has been on it for years for mood stabilization, a referral to psychiatry for monitoring his mood d/o may be feasible.  sa ----- Message ----- From: Darreld Mclean, MD Sent: 11/03/2016   6:50 PM To: Star Age, MD  Francene Finders, I wanted to touch base with you about [place].  Dr. Carles Collet saw him at movement disorders clinic and they talked about a DBS.  However she would like to try and stop his Depakote first in case this could be causing his tremor.  Would it be ok with you if I taper him off his Depakote?  He endorses no history of seizure, manic episode or psychiatric hospitalization.  He is not quite sure why he is on Depakote, but thinks that it stems back to when he came off narcotics years ago and had a very difficulty time with withdrawal sx Thanks so much JC

## 2016-11-26 NOTE — Telephone Encounter (Signed)
Called and spoke with [name]- they are willing to try and taper off his depakote.  It is difficult if not impossible to get a timely psychiatry appt in [place]. However he is going to be out of town for a few weeks to visit a sick friend so we will delay until he gets home

## 2016-12-03 ENCOUNTER — Other Ambulatory Visit: Payer: Self-pay | Admitting: Emergency Medicine

## 2016-12-03 MED ORDER — FUROSEMIDE 20 MG PO TABS: 20.0000 mg | ORAL_TABLET | Freq: Every day | ORAL | 3 refills | Status: DC

## 2016-12-31 ENCOUNTER — Other Ambulatory Visit: Payer: Self-pay | Admitting: Allergy

## 2016-12-31 MED ORDER — FLUTICASONE PROPIONATE 50 MCG/ACT NA SUSP: 2.0000 | Freq: Every day | NASAL | 5 refills | Status: DC

## 2017-01-26 ENCOUNTER — Other Ambulatory Visit: Payer: Self-pay | Admitting: Emergency Medicine

## 2017-01-26 MED ORDER — PROPRANOLOL HCL 10 MG PO TABS: 10.0000 mg | ORAL_TABLET | Freq: Two times a day (BID) | ORAL | 0 refills | Status: DC

## 2017-02-04 DIAGNOSIS — H401121 Primary open-angle glaucoma, left eye, mild stage: Secondary | ICD-10-CM | POA: Diagnosis not present

## 2017-02-04 DIAGNOSIS — H401112 Primary open-angle glaucoma, right eye, moderate stage: Secondary | ICD-10-CM | POA: Diagnosis not present

## 2017-02-04 DIAGNOSIS — Z9889 Other specified postprocedural states: Secondary | ICD-10-CM | POA: Diagnosis not present

## 2017-02-11 ENCOUNTER — Other Ambulatory Visit: Payer: Self-pay | Admitting: Family Medicine

## 2017-02-16 ENCOUNTER — Telehealth: Payer: Self-pay | Admitting: Family Medicine

## 2017-02-16 NOTE — Telephone Encounter (Signed)
Copied from [place] [id number]. Topic: Quick Communication - Rx Refill/Question >> Feb 16, 2017  9:47 AM Scherrie Gerlach wrote: Has the patient contacted their pharmacy? {yes   Pt request refill   allopurinol (ZYLOPRIM) 300 MG tablet  omeprazole (PRILOSEC) 40 MG capsule  Pt is completely out and [facility] recommend pt call our office to expedite  [facility]   Agent: Please be advised that RX refills may take up to 48 hours. We ask that you follow-up with your pharmacy.

## 2017-02-17 ENCOUNTER — Other Ambulatory Visit: Payer: Self-pay | Admitting: Emergency Medicine

## 2017-02-17 MED ORDER — ALLOPURINOL 300 MG PO TABS: 300.0000 mg | ORAL_TABLET | Freq: Every day | ORAL | 0 refills | Status: DC

## 2017-02-17 MED ORDER — OMEPRAZOLE 40 MG PO CPDR: 40.0000 mg | DELAYED_RELEASE_CAPSULE | Freq: Every day | ORAL | 0 refills | Status: DC

## 2017-02-17 NOTE — Telephone Encounter (Signed)
Refill sent to [facility] per pt request.

## 2017-02-17 NOTE — Telephone Encounter (Signed)
Pt requesting refills for Zyloprim and Prilosec. Not sure when they were filled last.

## 2017-02-20 ENCOUNTER — Telehealth: Payer: Self-pay | Admitting: Family Medicine

## 2017-02-20 NOTE — Telephone Encounter (Signed)
Copied from [place] [id number]. Topic: Quick Communication - See Telephone Encounter >> Feb 20, 2017 10:58 AM Synthia Innocent wrote: CRM for notification. See Telephone encounter for:  Requesting refill on tamsulosin (FLOMAX) 0.4 MG CAPS capsule. [facility] Ref # [id number]

## 2017-02-20 NOTE — Telephone Encounter (Signed)
Copied from [place] [id number]. Topic: Quick Communication - See Telephone Encounter >> Feb 20, 2017 10:58 AM Synthia Innocent wrote: CRM for notification. See Telephone encounter for:  Requesting refill on tamsulosin (FLOMAX) 0.4 MG CAPS capsule. [facility] Ref # [id number]

## 2017-02-23 ENCOUNTER — Telehealth: Payer: Self-pay | Admitting: Family Medicine

## 2017-02-23 ENCOUNTER — Other Ambulatory Visit: Payer: Self-pay

## 2017-02-23 MED ORDER — TAMSULOSIN HCL 0.4 MG PO CAPS: 0.4000 mg | ORAL_CAPSULE | Freq: Every day | ORAL | 3 refills | Status: DC

## 2017-02-23 NOTE — Telephone Encounter (Signed)
Copied from [place] [phone]. Topic: Quick Communication - See Telephone Encounter >> Feb 23, 2017  9:23 AM Ahmed Prima L wrote: CRM for notification. See Telephone encounter for:   [facility] called regarding tamsulosin , they have sent a rx refill over & wants to remind the dr to followup on this. Please advise. They stated they sent 2 (first one on the 3rd of December, 2nd one was sent over on the 5th). Fax number is [phone]. Reference number is [id number]  02/23/17.

## 2017-03-03 ENCOUNTER — Ambulatory Visit: Payer: Medicare Other | Admitting: Neurology
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
## 2017-03-05 ENCOUNTER — Ambulatory Visit (INDEPENDENT_AMBULATORY_CARE_PROVIDER_SITE_OTHER): Payer: Medicare Other | Admitting: Family Medicine

## 2017-03-05 ENCOUNTER — Encounter: Payer: Self-pay | Admitting: Family Medicine

## 2017-03-05 VITALS — BP 94/54 | HR 63 | Temp 97.7°F | Resp 16 | Ht 70.0 in | Wt 216.2 lb

## 2017-03-05 DIAGNOSIS — Z13220 Encounter for screening for lipoid disorders: Secondary | ICD-10-CM | POA: Diagnosis not present

## 2017-03-05 DIAGNOSIS — F5101 Primary insomnia: Secondary | ICD-10-CM | POA: Diagnosis not present

## 2017-03-05 DIAGNOSIS — D649 Anemia, unspecified: Secondary | ICD-10-CM

## 2017-03-05 DIAGNOSIS — Z9884 Bariatric surgery status: Secondary | ICD-10-CM

## 2017-03-05 DIAGNOSIS — R200 Anesthesia of skin: Secondary | ICD-10-CM | POA: Diagnosis not present

## 2017-03-05 DIAGNOSIS — E559 Vitamin D deficiency, unspecified: Secondary | ICD-10-CM | POA: Diagnosis not present

## 2017-03-05 DIAGNOSIS — E039 Hypothyroidism, unspecified: Secondary | ICD-10-CM | POA: Diagnosis not present

## 2017-03-05 DIAGNOSIS — Z131 Encounter for screening for diabetes mellitus: Secondary | ICD-10-CM | POA: Diagnosis not present

## 2017-03-05 DIAGNOSIS — Z5181 Encounter for therapeutic drug level monitoring: Secondary | ICD-10-CM

## 2017-03-05 DIAGNOSIS — Z130 Encounter for screening for diseases of the blood and blood-forming organs and certain disorders involving the immune mechanism: Secondary | ICD-10-CM | POA: Diagnosis not present

## 2017-03-05 LAB — LIPID PANEL
Cholesterol: 134 mg/dL (ref 0–200)
HDL: 54 mg/dL (ref >39.00)
LDL Cholesterol: 69 mg/dL (ref 0–99)
NonHDL: 79.97
TRIGLYCERIDES: 54 mg/dL (ref 0.0–149.0)
Total CHOL/HDL Ratio: 2
VLDL: 10.8 mg/dL (ref 0.0–40.0)

## 2017-03-05 LAB — CBC
HEMATOCRIT: 33.2 % — AB (ref 39.0–52.0)
HEMOGLOBIN: 10.7 g/dL — AB (ref 13.0–17.0)
MCHC: 32.2 g/dL (ref 30.0–36.0)
MCV: 88.1 fl (ref 78.0–100.0)
Platelets: 174 10*3/uL (ref 150.0–400.0)
RBC: 3.77 Mil/uL — AB (ref 4.22–5.81)
RDW: 15.9 % — AB (ref 11.5–15.5)
WBC: 4 10*3/uL (ref 4.0–10.5)

## 2017-03-05 LAB — COMPREHENSIVE METABOLIC PANEL
ALBUMIN: 3.8 g/dL (ref 3.5–5.2)
ALK PHOS: 116 U/L (ref 39–117)
ALT: 13 U/L (ref 0–53)
AST: 21 U/L (ref 0–37)
BUN: 22 mg/dL (ref 6–23)
CHLORIDE: 111 meq/L (ref 96–112)
CO2: 28 mEq/L (ref 19–32)
Calcium: 8.5 mg/dL (ref 8.4–10.5)
Creatinine, Ser: 0.98 mg/dL (ref 0.40–1.50)
GFR: 81.78 mL/min (ref >60.00)
Glucose, Bld: 88 mg/dL (ref 70–99)
POTASSIUM: 4.4 meq/L (ref 3.5–5.1)
Sodium: 143 mEq/L (ref 135–145)
TOTAL PROTEIN: 6.6 g/dL (ref 6.0–8.3)
Total Bilirubin: 0.5 mg/dL (ref 0.2–1.2)

## 2017-03-05 LAB — TSH: TSH: 2.2 u[IU]/mL (ref 0.35–4.50)

## 2017-03-05 LAB — VITAMIN D 25 HYDROXY (VIT D DEFICIENCY, FRACTURES): VITD: 44.07 ng/mL (ref 30.00–100.00)

## 2017-03-05 LAB — VITAMIN B12

## 2017-03-05 LAB — HEMOGLOBIN A1C: HEMOGLOBIN A1C: 5.6 % (ref 4.6–6.5)

## 2017-03-05 MED ORDER — GABAPENTIN 600 MG PO TABS: 1200.0000 mg | ORAL_TABLET | Freq: Three times a day (TID) | ORAL | 2 refills | Status: DC

## 2017-03-05 MED ORDER — TRAZODONE HCL 50 MG PO TABS: 25.0000 mg | ORAL_TABLET | Freq: Every evening | ORAL | 3 refills | Status: DC | PRN

## 2017-03-05 NOTE — Patient Instructions (Addendum)
It was good to see you today- I will be in touch with your labs asap Try stopping the lasix and see if your legs swell. If they do not,you do not have to continue to use this medication We will try increasing your gabapentin to 1200 three times a day- I hope that this may help with your chronic pain  Increase to 1200 mg TID gradually!    We will also try the trazodone as needed for sleep- start with a 1/2 tablet, increase to 50 mg if needed

## 2017-04-01 NOTE — Progress Notes (Signed)
Subjective:   Bryan Hodges is a 67 y.o. male who presents for Medicare Annual/Subsequent preventive examination. The Patient was informed that the wellness visit is to identify future health risk and educate and initiate measures that can reduce risk for increased disease through the lifespan.   Review of Systems: No ROS.  Medicare Wellness Visit. Additional risk factors are reflected in the social history. Cardiac Risk Factors include: advanced age (>16men, >25 women) Sleep patterns: Sleeps well with Trazodone for about 9 hrs. Home Safety/Smoke Alarms: Feels safe in home. Smoke alarms in place.   Male:   CCS-  Pt reports last done 5 yrs ago in [place] PSA- No results found for: PSA      Objective:    Vitals: BP 104/70   Pulse 71   SpO2 98%   There is no height or weight on file to calculate BMI.  Advanced Directives 04/06/2017 03/20/2016 12/24/2015  Does Patient Have a Medical Advance Directive? No No No  Would patient like information on creating a medical advance directive? No - Patient declined Yes (MAU/Ambulatory/Procedural Areas - Information given) -    Tobacco Social History   Tobacco Use  Smoking Status Former Smoker  . Types: Cigarettes  . Last attempt to quit: 03/17/1976  . Years since quitting: 41.0  Smokeless Tobacco Former Systems developer  . Types: Chew     Counseling given: Not Answered   Clinical Intake: Pain : No/denies pain   Past Medical History:  Diagnosis Date  . Depression   . Eating disorder   . Elevated blood pressure reading   . Frequent headaches   . Glaucoma   . H/O blood clots    associated with an injury per patient  . Hay fever   . Kidney stone   . Phlebitis    Past Surgical History:  Procedure Laterality Date  . APPENDECTOMY  2000  . CHOLECYSTECTOMY  2000  . GASTRIC BYPASS  2000  . GASTRIC BYPASS  2003  . OTHER SURGICAL HISTORY  2001   Reconstructive surgery   . SHOULDER SURGERY     Family History  Problem Relation Age of Onset  .  Arthritis Mother   . Hyperlipidemia Mother   . Heart disease Mother   . Hypertension Mother   . Alcohol abuse Mother   . Heart attack Mother   . Hyperlipidemia Father   . Heart disease Father   . Hypertension Father   . Alcohol abuse Sister   . Other Sister        Brain tumor  . Alcohol abuse Brother   . Other Brother        Brain tumor  . Ovarian cancer Paternal Aunt   . Diabetes Paternal Aunt   . Stroke Paternal Grandmother   . Asthma Neg Hx   . Eczema Neg Hx   . Urticaria Neg Hx   . Immunodeficiency Neg Hx   . Angioedema Neg Hx   . Allergic rhinitis Neg Hx    Social History   Socioeconomic History  . Marital status: Married    Spouse name: Not on file  . Number of children: Not on file  . Years of education: Not on file  . Highest education level: Not on file  Social Needs  . Financial resource strain: Not on file  . Food insecurity - worry: Not on file  . Food insecurity - inability: Not on file  . Transportation needs - medical: Not on file  . Transportation needs -  non-medical: Not on file  Occupational History  . Not on file  Tobacco Use  . Smoking status: Former Smoker    Types: Cigarettes    Last attempt to quit: 03/17/1976    Years since quitting: 41.0  . Smokeless tobacco: Former Systems developer    Types: Chew  Substance and Sexual Activity  . Alcohol use: Yes    Comment: occasional beer  . Drug use: No  . Sexual activity: Not on file  Other Topics Concern  . Not on file  Social History Narrative  . Not on file    Outpatient Encounter Medications as of 04/06/2017  Medication Sig  . allopurinol (ZYLOPRIM) 300 MG tablet Take 1 tablet (300 mg total) by mouth daily.  Marland Kitchen aspirin 81 MG tablet Take 81 mg by mouth daily.  . dorzolamide-timolol (COSOPT) 22.3-6.8 MG/ML ophthalmic solution Place 1 drop into both eyes 2 (two) times daily.  . fluticasone (FLONASE) 50 MCG/ACT nasal spray Place 2 sprays into both nostrils at bedtime.  . furosemide (LASIX) 20 MG tablet  Take 1 tablet (20 mg total) by mouth daily.  Marland Kitchen gabapentin (NEURONTIN) 600 MG tablet Take 2 tablets (1,200 mg total) by mouth 3 (three) times daily.  Marland Kitchen latanoprost (XALATAN) 0.005 % ophthalmic solution Place 1 drop into both eyes daily.  Marland Kitchen levothyroxine (SYNTHROID, LEVOTHROID) 50 MCG tablet Take 1 tablet (50 mcg total) by mouth daily before breakfast.  . omeprazole (PRILOSEC) 40 MG capsule Take 1 capsule (40 mg total) by mouth daily.  . primidone (MYSOLINE) 50 MG tablet Take 1 1/2 pills each night for 2 weeks, then 2 pills each bedtime thereafter.  . tamsulosin (FLOMAX) 0.4 MG CAPS capsule Take 1 capsule (0.4 mg total) by mouth daily.  . traZODone (DESYREL) 50 MG tablet Take 0.5-1 tablets (25-50 mg total) by mouth at bedtime as needed for sleep.  Marland Kitchen venlafaxine XR (EFFEXOR-XR) 75 MG 24 hr capsule Take 3 capsules (225 mg total) by mouth daily.  . vitamin B-12 (CYANOCOBALAMIN) 500 MCG tablet Take 1 tablet (500 mcg total) by mouth daily.  . Vitamin D, Ergocalciferol, (DRISDOL) 50000 units CAPS capsule Take 2x a week  . [DISCONTINUED] levothyroxine (SYNTHROID, LEVOTHROID) 50 MCG tablet Take 1 tablet (50 mcg total) by mouth daily before breakfast.   No facility-administered encounter medications on file as of 04/06/2017.     Activities of Daily Living In your present state of health, do you have any difficulty performing the following activities: 04/06/2017  Hearing? N  Vision? N  Difficulty concentrating or making decisions? N  Walking or climbing stairs? N  Comment uses cane  Dressing or bathing? N  Doing errands, shopping? N  Preparing Food and eating ? N  Using the Toilet? N  In the past six months, have you accidently leaked urine? N  Do you have problems with loss of bowel control? N  Managing your Medications? N  Managing your Finances? N  Housekeeping or managing your Housekeeping? N  Some recent data might be hidden    Patient Care Team: Copland, Gay Filler, MD as PCP - General  (Family Medicine)   Assessment:   This is a routine wellness examination for Bryan Hodges. Physical assessment deferred to PCP  Exercise Activities and Dietary recommendations Exercise limited by: orthopedic condition(s) Diet (meal preparation, eat out, water intake, caffeinated beverages, dairy products, fruits and vegetables): in general, an "unhealthy" diet     Goals    None      Fall Risk Fall Risk  04/06/2017  03/20/2016  Falls in the past year? No No    Depression Screen PHQ 2/9 Scores 04/06/2017 03/20/2016  PHQ - 2 Score 0 0    Cognitive Function MMSE - Mini Mental State Exam 04/06/2017 03/20/2016  Orientation to time 5 5  Orientation to Place 5 5  Registration 3 3  Attention/ Calculation 5 5  Recall 1 2  Language- name 2 objects 2 2  Language- repeat 1 1  Language- follow 3 step command 3 3  Language- read & follow direction 1 1  Write a sentence 1 1  Copy design 1 1  Total score 28 29        Immunization History  Administered Date(s) Administered  . Influenza,inj,Quad PF,6+ Mos 11/14/2015  . Influenza-Unspecified 11/18/2016   Screening Tests Health Maintenance  Topic Date Due  . HIV Screening  12/08/1967  . TETANUS/TDAP  03/18/2023  . COLONOSCOPY  03/17/2024  . INFLUENZA VACCINE  Completed  . Hepatitis C Screening  Completed      Plan:   Follow up with Dr.Copland as scheduled  Continue to eat heart healthy diet (full of fruits, vegetables, whole grains, lean protein, water--limit salt, fat, and sugar intake) and increase physical activity as tolerated.  Continue doing brain stimulating activities (puzzles, reading, adult coloring books, staying active) to keep memory sharp.   I have personally reviewed and noted the following in the patient's chart:   . Medical and social history . Use of alcohol, tobacco or illicit drugs  . Current medications and supplements . Functional ability and status . Nutritional status . Physical activity . Advanced  directives . List of other physicians . Hospitalizations, surgeries, and ER visits in previous 12 months . Vitals . Screenings to include cognitive, depression, and falls . Referrals and appointments  In addition, I have reviewed and discussed with patient certain preventive protocols, quality metrics, and best practice recommendations. A written personalized care plan for preventive services as well as general preventive health recommendations were provided to patient.     Shela Nevin, [place]  04/06/2017

## 2017-04-03 ENCOUNTER — Other Ambulatory Visit: Payer: Self-pay | Admitting: Emergency Medicine

## 2017-04-03 MED ORDER — LEVOTHYROXINE SODIUM 50 MCG PO TABS: 50.0000 ug | ORAL_TABLET | Freq: Every day | ORAL | 4 refills | Status: DC

## 2017-04-04 NOTE — Progress Notes (Addendum)
[place] at [facility] [address], [place], [place] [address] [phone]  Date:  04/06/2017   Name:  Bryan Hodges   DOB:  1951/03/16   MRN:  [id number]  PCP:  Darreld Mclean, MD    Chief Complaint: Follow-up (Pt here for sodium recheck. )   History of Present Illness:  Bryan Hodges is a 67 y.o. very pleasant male patient who presents with the following:  Here today for a sodium check,  Recent visit here last month:  Here today with a few concerns Labs pending as above. History of gastric bypass so will also check B12 for him TSH to monitor thyroid replacement therapy We can go up on his gabapentin- he is currently taking 800 mg TID>  Will gradually titrate up to 1200 TID to see if this may help with his chronic pain Also decided to use trazodone for sleep.   Will need to monitor his sodium as he is also on effexor- plan to do this as a lab visit only in a few weeks.  Will order now  He and [name] are moving to a suburb of [place] in about 6 weeks as their daughter and family are relocating there.  They plan to continue to see me as PCP however Lab notes from last visit:  Metabolic profile looks fine.  Remember we will want to check a sodium level in 3-4 weeks - this is needed due to addition of trazodone for sleep.  I have ordered this test for you; please come by for a lab visit only before you move! A1c (average blood sugar) is normal Cholesterol looks great Thyroid is normal- we can continue current dose of thyroid replacement medication Vitamin D level is fine Your b12 is a bit high- however this should not harm you.  You can continue your vitamin B12 supplement.  My only concern is that your red cell counts have dropped since last check for some reason.  I would like to recheck this as well when you come in for your sodium recheck.  In addition, I am including stool cards for you to use at home- I want to make sure you  are not losing any blood in your stool Take care!  Let's plan to visit in 4 months or so  He and his wife have been able to find a place to live near [facility]- they are moving there as  Trazodone-  He does feel like this is helping with his sleep. He does not take it if he has to be up early the next day He does have his stool cards and will do these, but did not get around to it yet.   We will also repeat his CBC today  We did try titrating up on his gabapentin at last visit- he does feel like his shoulder and joints are a bit better Patient Active Problem List   Diagnosis Date Noted  . Gastroesophageal reflux disease without esophagitis 06/17/2016  . Other allergic rhinitis 06/17/2016  . Current use of beta blocker 06/17/2016  . Rheumatoid arthritis involving right shoulder ([place]) 06/17/2016  . Complications of gastric bypass surgery 06/17/2016  . Lower extremity edema 03/26/2016  . Mild diastolic dysfunction 84/16/6063  . Hypothyroidism due to acquired atrophy of thyroid 11/14/2015  . Glaucoma 11/14/2015  . Leg weakness, bilateral 11/14/2015  . History of back injury 11/14/2015  . Bipolar affective disorder in remission ([place])  11/14/2015    Past Medical History:  Diagnosis Date  . Depression   . Eating disorder   . Elevated blood pressure reading   . Frequent headaches   . Glaucoma   . H/O blood clots    associated with an injury per patient  . Hay fever   . Kidney stone   . Phlebitis     Past Surgical History:  Procedure Laterality Date  . APPENDECTOMY  2000  . CHOLECYSTECTOMY  2000  . GASTRIC BYPASS  2000  . GASTRIC BYPASS  2003  . OTHER SURGICAL HISTORY  2001   Reconstructive surgery   . SHOULDER SURGERY      Social History   Tobacco Use  . Smoking status: Former Smoker    Types: Cigarettes    Last attempt to quit: 03/17/1976    Years since quitting: 41.0  . Smokeless tobacco: Former Systems developer    Types: Chew  Substance Use Topics  . Alcohol use: Yes     Comment: occasional beer  . Drug use: No    Family History  Problem Relation Age of Onset  . Arthritis Mother   . Hyperlipidemia Mother   . Heart disease Mother   . Hypertension Mother   . Alcohol abuse Mother   . Heart attack Mother   . Hyperlipidemia Father   . Heart disease Father   . Hypertension Father   . Alcohol abuse Sister   . Other Sister        Brain tumor  . Alcohol abuse Brother   . Other Brother        Brain tumor  . Ovarian cancer Paternal Aunt   . Diabetes Paternal Aunt   . Stroke Paternal Grandmother   . Asthma Neg Hx   . Eczema Neg Hx   . Urticaria Neg Hx   . Immunodeficiency Neg Hx   . Angioedema Neg Hx   . Allergic rhinitis Neg Hx     No Known Allergies  Medication list has been reviewed and updated.  Current Outpatient Medications on File Prior to Visit  Medication Sig Dispense Refill  . allopurinol (ZYLOPRIM) 300 MG tablet Take 1 tablet (300 mg total) by mouth daily. 90 tablet 0  . aspirin 81 MG tablet Take 81 mg by mouth daily.    . dorzolamide-timolol (COSOPT) 22.3-6.8 MG/ML ophthalmic solution Place 1 drop into both eyes 2 (two) times daily.    . fluticasone (FLONASE) 50 MCG/ACT nasal spray Place 2 sprays into both nostrils at bedtime. 16 g 5  . furosemide (LASIX) 20 MG tablet Take 1 tablet (20 mg total) by mouth daily. 90 tablet 3  . gabapentin (NEURONTIN) 600 MG tablet Take 2 tablets (1,200 mg total) by mouth 3 (three) times daily. 540 tablet 2  . latanoprost (XALATAN) 0.005 % ophthalmic solution Place 1 drop into both eyes daily.    Marland Kitchen levothyroxine (SYNTHROID, LEVOTHROID) 50 MCG tablet Take 1 tablet (50 mcg total) by mouth daily before breakfast. 30 tablet 4  . omeprazole (PRILOSEC) 40 MG capsule Take 1 capsule (40 mg total) by mouth daily. 90 capsule 0  . primidone (MYSOLINE) 50 MG tablet Take 1 1/2 pills each night for 2 weeks, then 2 pills each bedtime thereafter. 60 tablet 5  . tamsulosin (FLOMAX) 0.4 MG CAPS capsule Take 1 capsule (0.4  mg total) by mouth daily. 90 capsule 3  . traZODone (DESYREL) 50 MG tablet Take 0.5-1 tablets (25-50 mg total) by mouth at bedtime as needed for sleep.  30 tablet 3  . venlafaxine XR (EFFEXOR-XR) 75 MG 24 hr capsule Take 3 capsules (225 mg total) by mouth daily. 270 capsule 3  . vitamin B-12 (CYANOCOBALAMIN) 500 MCG tablet Take 1 tablet (500 mcg total) by mouth daily. 30 tablet 3  . Vitamin D, Ergocalciferol, (DRISDOL) 50000 units CAPS capsule Take 2x a week 30 capsule 3   No current facility-administered medications on file prior to visit.     Review of Systems:  As per HPI- otherwise negative.   Physical Examination: Vitals:   04/06/17 1006  BP: 104/70  Pulse: 71  Temp: 97.8 F (36.6 C)  SpO2: 98%   Vitals:   04/06/17 1006  Weight: 214 lb 6.4 oz (97.3 kg)  Height: 5\' 10"  (1.778 m)   Body mass index is 30.76 kg/m. Ideal Body Weight: Weight in (lb) to have BMI = 25: 173.9  GEN: WDWN, NAD, Non-toxic, A & O x 3, overweight, looks well HEENT: Atraumatic, Normocephalic. Neck supple. No masses, No LAD. Ears and Nose: No external deformity. CV: RRR, No M/G/R. No JVD. No thrill. No extra heart sounds. PULM: CTA B, no wheezes, crackles, rhonchi. No retractions. No resp. distress. No accessory muscle use. EXTR: No c/c/e NEURO Normal gait for pt, using his cane PSYCH: Normally interactive. Conversant. Not depressed or anxious appearing.  Calm demeanor.    Assessment and Plan: Medication monitoring encounter - Plan: Basic metabolic panel  Anemia, unspecified type - Plan: CBC  Here today to recheck his sodium level due to addition of trazodone to effexor, and also to repeat his CBC He has not done his stool cards yet but will do so   Signed Lamar Blinks, MD  Received his labs and gave him a call- conveyed results to [place]. Letter to pt as well  Results for orders placed or performed in visit on 62/03/55  Basic metabolic panel  Result Value Ref Range   Sodium 141 135 -  145 mEq/L   Potassium 4.2 3.5 - 5.1 mEq/L   Chloride 109 96 - 112 mEq/L   CO2 26 19 - 32 mEq/L   Glucose, Bld 102 (H) 70 - 99 mg/dL   BUN 24 (H) 6 - 23 mg/dL   Creatinine, Ser 1.04 0.40 - 1.50 mg/dL   Calcium 8.7 8.4 - 10.5 mg/dL   GFR 76.34 >60.00 mL/min  CBC  Result Value Ref Range   WBC 3.0 (L) 4.0 - 10.5 K/uL   RBC 4.03 (L) 4.22 - 5.81 Mil/uL   Platelets 174.0 150.0 - 400.0 K/uL   Hemoglobin 11.2 (L) 13.0 - 17.0 g/dL   HCT 34.6 (L) 39.0 - 52.0 %   MCV 85.8 78.0 - 100.0 fl   MCHC 32.4 30.0 - 36.0 g/dL   RDW 17.3 (H) 11.5 - 15.5 %

## 2017-04-06 ENCOUNTER — Ambulatory Visit (INDEPENDENT_AMBULATORY_CARE_PROVIDER_SITE_OTHER): Payer: Medicare Other | Admitting: *Deleted

## 2017-04-06 ENCOUNTER — Ambulatory Visit (INDEPENDENT_AMBULATORY_CARE_PROVIDER_SITE_OTHER): Payer: Medicare Other | Admitting: Family Medicine

## 2017-04-06 ENCOUNTER — Encounter: Payer: Self-pay | Admitting: Family Medicine

## 2017-04-06 VITALS — BP 104/70 | HR 71

## 2017-04-06 VITALS — BP 104/70 | HR 71 | Temp 97.8°F | Ht 70.0 in | Wt 214.4 lb

## 2017-04-06 DIAGNOSIS — D649 Anemia, unspecified: Secondary | ICD-10-CM

## 2017-04-06 DIAGNOSIS — Z0000 Encounter for general adult medical examination without abnormal findings: Secondary | ICD-10-CM

## 2017-04-06 DIAGNOSIS — Z5181 Encounter for therapeutic drug level monitoring: Secondary | ICD-10-CM

## 2017-04-06 LAB — BASIC METABOLIC PANEL
BUN: 24 mg/dL — ABNORMAL HIGH (ref 6–23)
CHLORIDE: 109 meq/L (ref 96–112)
CO2: 26 meq/L (ref 19–32)
Calcium: 8.7 mg/dL (ref 8.4–10.5)
Creatinine, Ser: 1.04 mg/dL (ref 0.40–1.50)
GFR: 76.34 mL/min (ref >60.00)
GLUCOSE: 102 mg/dL — AB (ref 70–99)
POTASSIUM: 4.2 meq/L (ref 3.5–5.1)
Sodium: 141 mEq/L (ref 135–145)

## 2017-04-06 LAB — CBC
HCT: 34.6 % — ABNORMAL LOW (ref 39.0–52.0)
HEMOGLOBIN: 11.2 g/dL — AB (ref 13.0–17.0)
MCHC: 32.4 g/dL (ref 30.0–36.0)
MCV: 85.8 fl (ref 78.0–100.0)
PLATELETS: 174 10*3/uL (ref 150.0–400.0)
RBC: 4.03 Mil/uL — ABNORMAL LOW (ref 4.22–5.81)
RDW: 17.3 % — ABNORMAL HIGH (ref 11.5–15.5)
WBC: 3 10*3/uL — ABNORMAL LOW (ref 4.0–10.5)

## 2017-04-06 NOTE — Progress Notes (Signed)
I have reviewed MWE by Ms. Britt and agree with her documentation  J Copland MD  

## 2017-04-06 NOTE — Patient Instructions (Signed)
  Bryan Hodges , Thank you for taking time to come for your Medicare Wellness Visit. I appreciate your ongoing commitment to your health goals. Please review the following plan we discussed and let me know if I can assist you in the future.   These are the goals we discussed: Goals    None      This is a list of the screening recommended for you and due dates:  Health Maintenance  Topic Date Due  . HIV Screening  12/08/1967  . Tetanus Vaccine  03/18/2023  . Colon Cancer Screening  03/17/2024  . Flu Shot  Completed  .  Hepatitis C: One time screening is recommended by Center for Disease Control  (CDC) for  adults born from [birth year] through 1965.   Completed   Continue to eat heart healthy diet (full of fruits, vegetables, whole grains, lean protein, water--limit salt, fat, and sugar intake) and increase physical activity as tolerated.  Continue doing brain stimulating activities (puzzles, reading, adult coloring books, staying active) to keep memory sharp.

## 2017-04-07 ENCOUNTER — Other Ambulatory Visit: Payer: Self-pay | Admitting: Family Medicine

## 2017-04-24 ENCOUNTER — Other Ambulatory Visit: Payer: Self-pay | Admitting: Family Medicine

## 2017-04-28 ENCOUNTER — Other Ambulatory Visit: Payer: Self-pay | Admitting: Emergency Medicine

## 2017-04-28 ENCOUNTER — Other Ambulatory Visit (INDEPENDENT_AMBULATORY_CARE_PROVIDER_SITE_OTHER): Payer: Medicare Other

## 2017-04-28 DIAGNOSIS — D649 Anemia, unspecified: Secondary | ICD-10-CM

## 2017-04-28 LAB — FECAL OCCULT BLOOD, IMMUNOCHEMICAL: Fecal Occult Bld: NEGATIVE

## 2017-05-03 ENCOUNTER — Encounter: Payer: Self-pay | Admitting: Family Medicine

## 2017-05-20 ENCOUNTER — Telehealth: Payer: Self-pay | Admitting: Family Medicine

## 2017-05-20 NOTE — Telephone Encounter (Signed)
Copied from [place] [phone]. Topic: Quick Communication - Rx Refill/Question >> May 20, 2017  1:18 PM Synthia Innocent wrote: Medication: venlafaxine XR (EFFEXOR-XR) 75 MG 24 hr capsule   Has the patient contacted their pharmacy? Yes.     (Agent: If no, request that the patient contact the pharmacy for the refill.)   Preferred Pharmacy (with phone number or street name): [facility]   Agent: Please be advised that RX refills may take up to 3 business days. We ask that you follow-up with your pharmacy.

## 2017-05-21 MED ORDER — VENLAFAXINE HCL ER 75 MG PO CP24: 225.0000 mg | ORAL_CAPSULE | Freq: Every day | ORAL | 3 refills | Status: DC

## 2017-05-25 ENCOUNTER — Telehealth: Payer: Self-pay | Admitting: Emergency Medicine

## 2017-05-25 MED ORDER — VENLAFAXINE HCL ER 75 MG PO CP24: 225.0000 mg | ORAL_CAPSULE | Freq: Every day | ORAL | 3 refills | Status: DC

## 2017-05-25 NOTE — Telephone Encounter (Signed)
[facility] [place] [place] [address] [phone]. Pt is out of meds

## 2017-05-25 NOTE — Telephone Encounter (Signed)
Refill has been resent to correct pharmacy.

## 2017-05-28 ENCOUNTER — Other Ambulatory Visit: Payer: Self-pay | Admitting: *Deleted

## 2017-06-15 ENCOUNTER — Other Ambulatory Visit: Payer: Self-pay | Admitting: Family Medicine

## 2017-06-15 DIAGNOSIS — G250 Essential tremor: Secondary | ICD-10-CM

## 2017-06-15 NOTE — Telephone Encounter (Signed)
Copied from [place] [phone]. Topic: Quick Communication - See Telephone Encounter >> Jun 15, 2017  1:18 PM Conception Chancy, NT wrote: CRM for notification. See Telephone encounter for: 06/15/17.  Patient is needing a refill on primidone (MYSOLINE) 50 MG tablet  please advise.  [facility] [phone] - [place], [address] [place] [address] Phone: [phone] Fax: [phone]

## 2017-06-16 MED ORDER — PRIMIDONE 50 MG PO TABS: ORAL_TABLET | ORAL | 3 refills | Status: DC

## 2017-06-16 NOTE — Telephone Encounter (Signed)
Pt requesting refill on primidone.

## 2017-06-16 NOTE — Telephone Encounter (Signed)
Primidone LOV: 04/06/17 PCP: [place]: [place], [place]

## 2017-06-22 ENCOUNTER — Other Ambulatory Visit: Payer: Self-pay | Admitting: Family Medicine

## 2017-06-25 ENCOUNTER — Other Ambulatory Visit: Payer: Self-pay | Admitting: Emergency Medicine

## 2017-08-08 IMAGING — DX DG CHEST 2V
2 series · 2 of 2 positions shown · non-contrast
Comparison: None.

CLINICAL DATA: Patient with lower extremity edema. Evaluate for
pulmonary edema.

EXAM:
CHEST  2 VIEW

[chest pa]
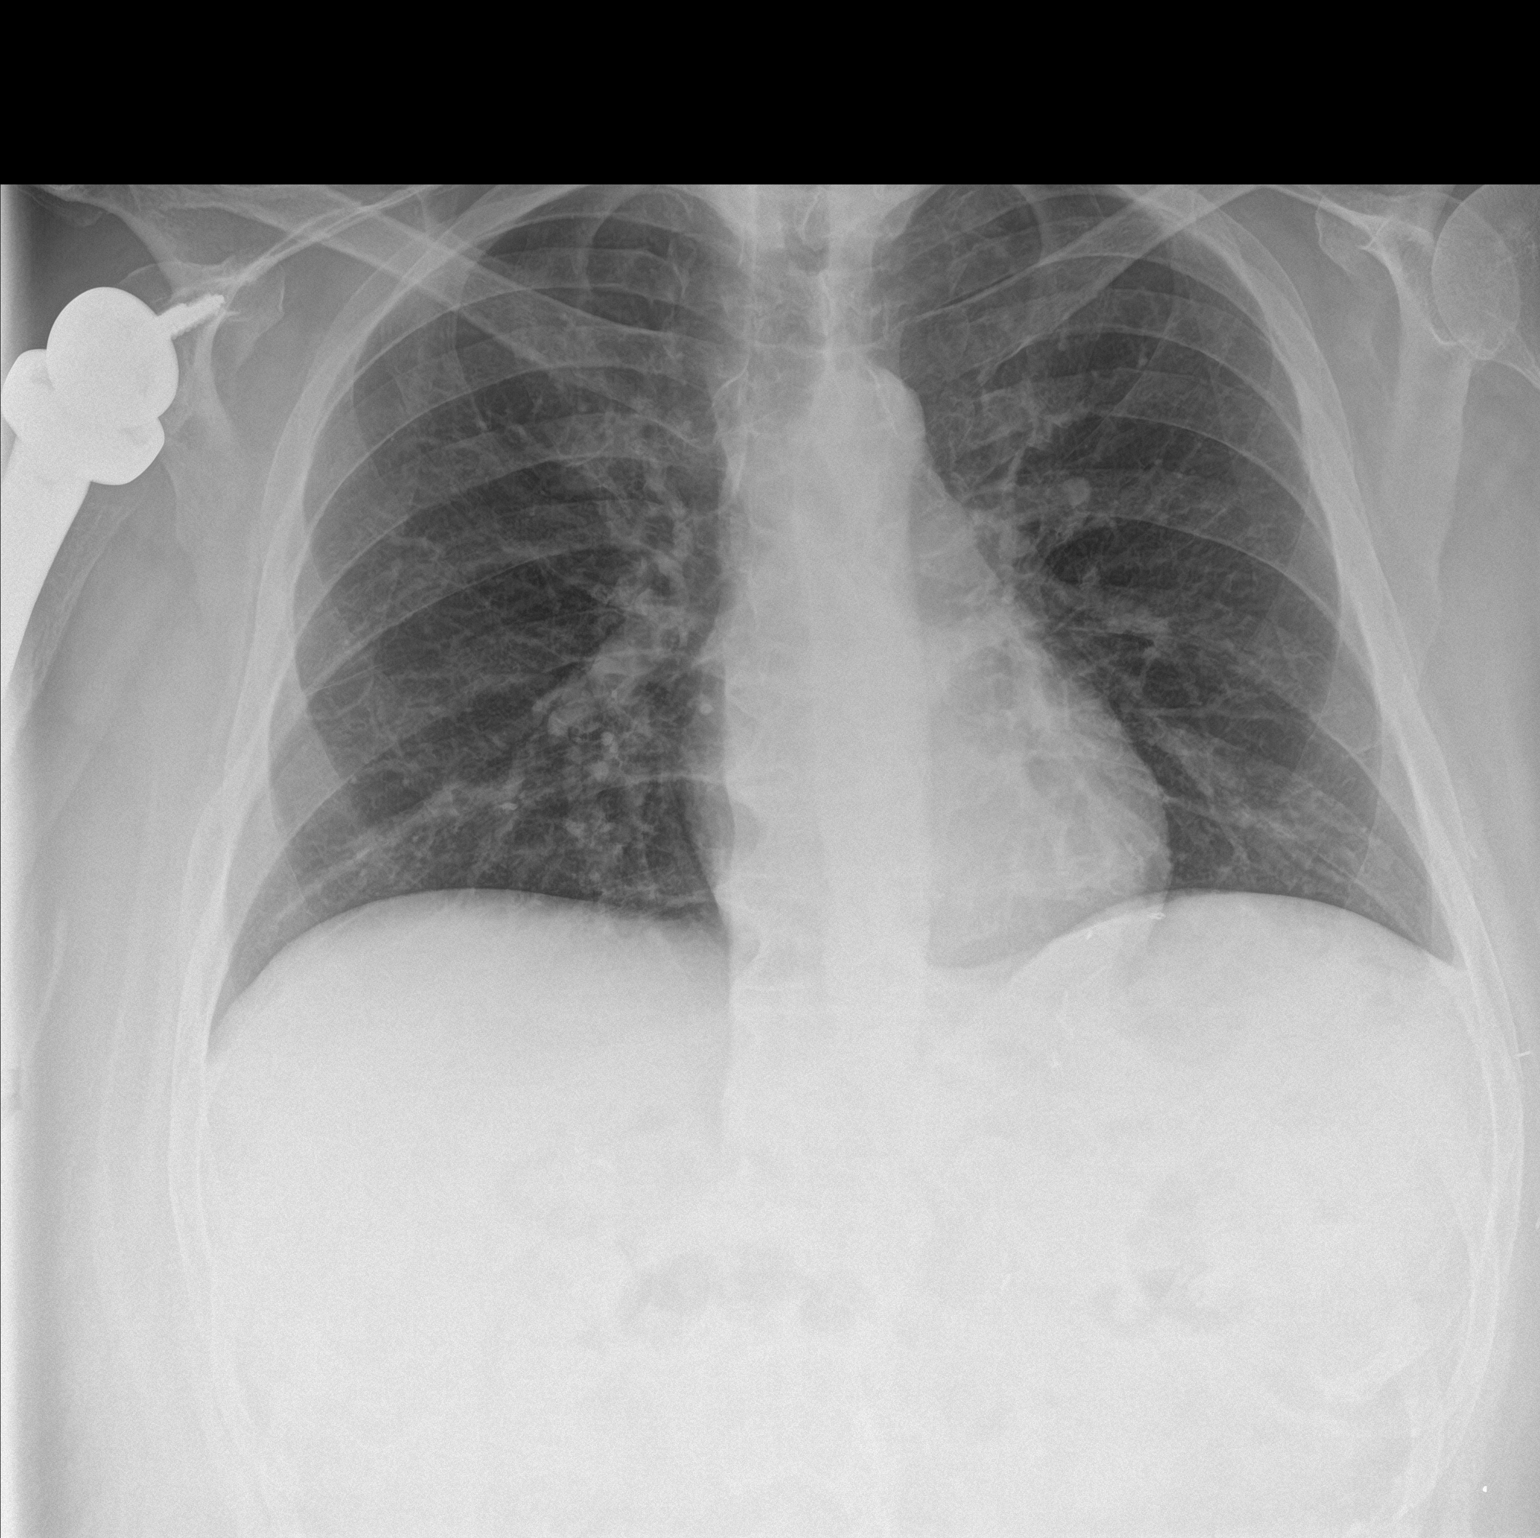

[chest lat]
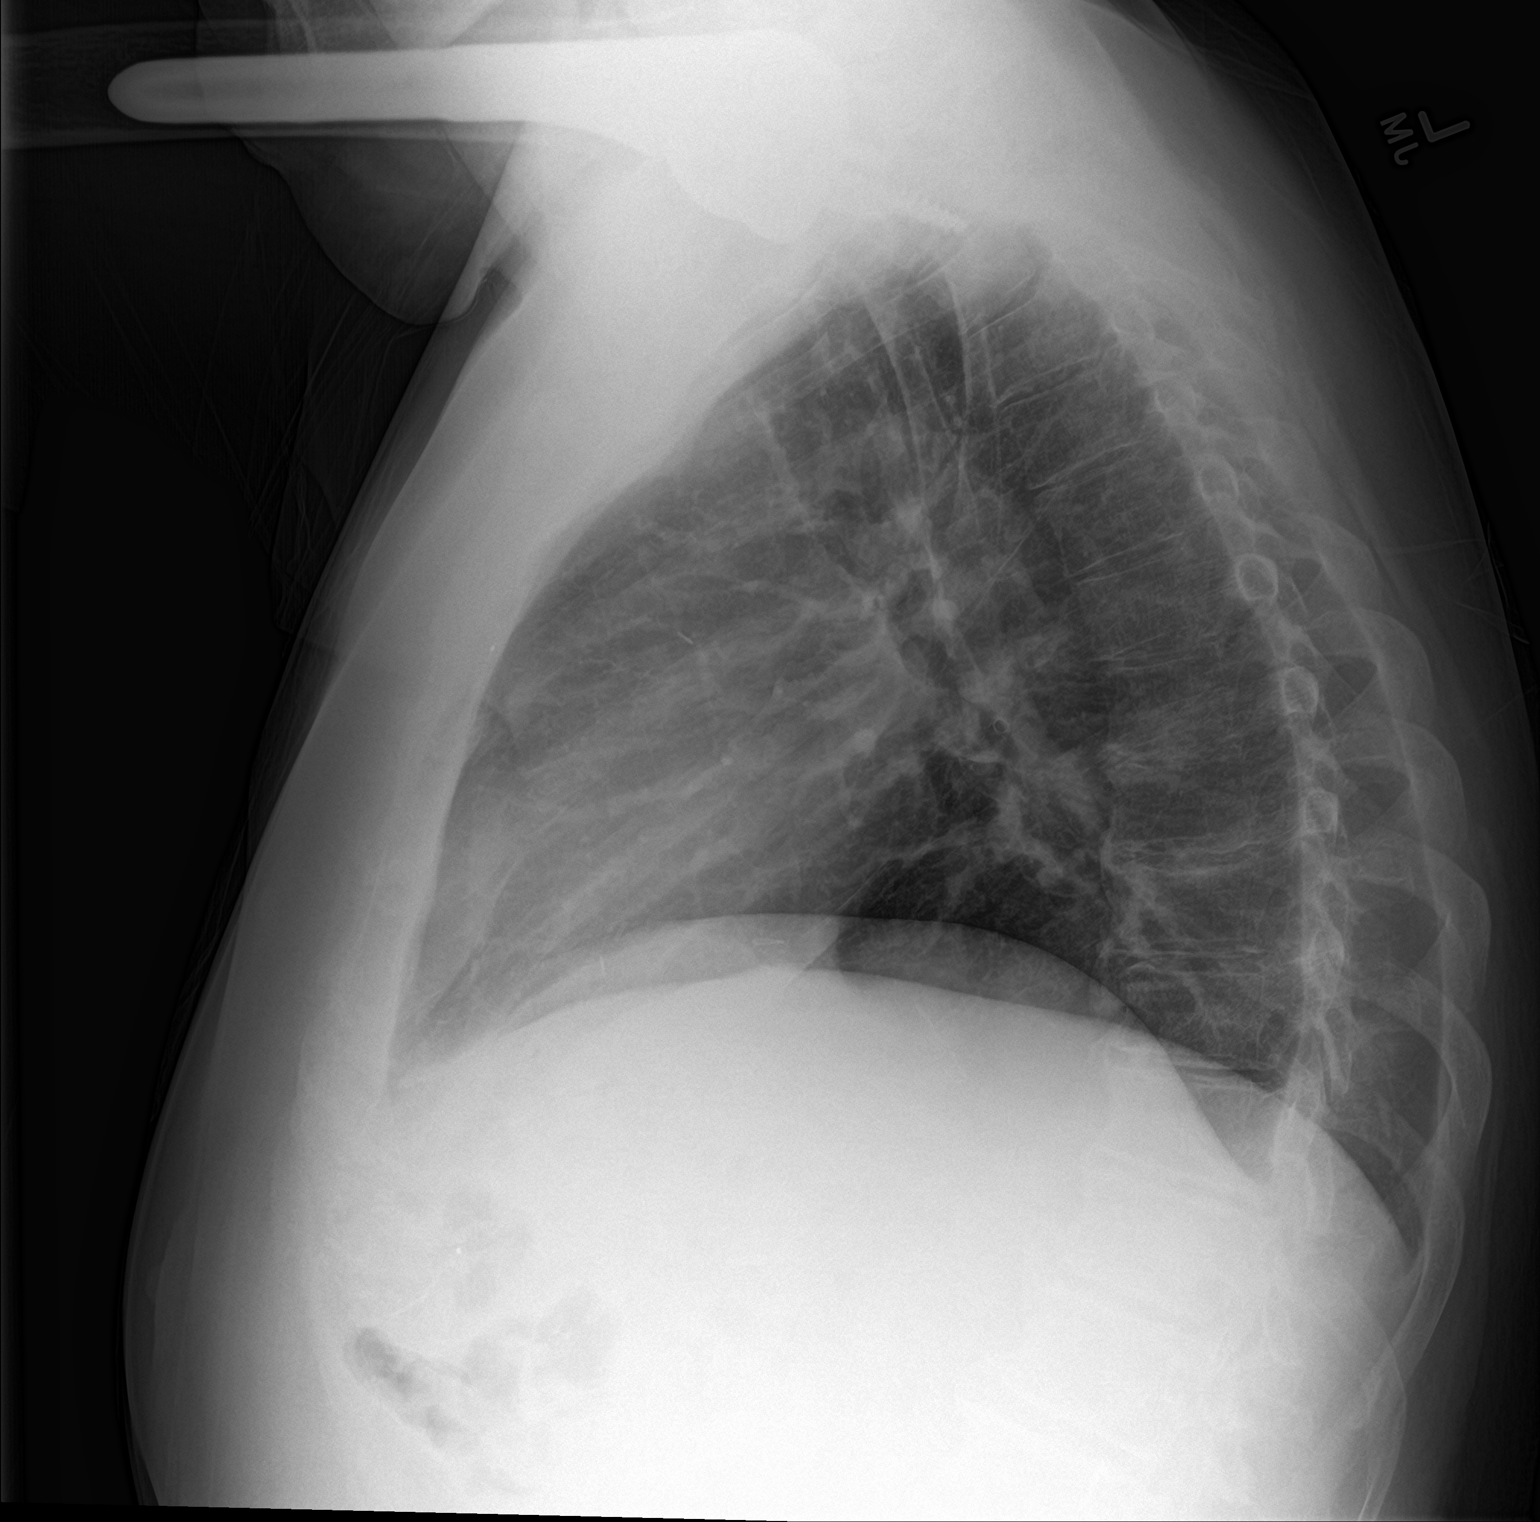

[2 of 2 positions shown; findings below may reference images not displayed]

FINDINGS: The heart size and mediastinal contours are within normal limits.
Both lungs are clear. The visualized skeletal structures are
unremarkable. Right shoulder arthroplasty.
IMPRESSION: No active cardiopulmonary disease.

## 2017-08-16 NOTE — Progress Notes (Addendum)
[place] at [facility] [address], [place], [place] [address] [phone]  Date:  08/17/2017   Name:  Bryan Hodges   DOB:  October 29, 1950   MRN:  [id number]  PCP:  Darreld Mclean, MD    Chief Complaint: Shoulder Pain (left shoulder pain, 3-4 months, worsening, no known injury, limited randge of motion) and Leg and Arm Cramps   History of Present Illness:  Bryan Hodges is a 67 y.o. very pleasant male patient who presents with the following:  Medically complex patient with history of hypothyroidism, glaucoma, gastric bypass here today with concern of shoulder pain Last seen here in January: Labs pending as above. History of gastric bypass so will also check B12 for him TSH to monitor thyroid replacement therapy We can go up on his gabapentin- he is currently taking 800 mg TID>Will gradually titrate up to 1200 TID to see if this may help with his chronic pain Also decided to use trazodone for sleep.  Will need to monitor his sodium as he is also on effexor- plan to do this as a lab visit only in a few weeks. Will order now  He and Bryan Hodges are moving to a suburb of [place] in about 6 weeks as their daughter and family are relocating there. They plan to continue to see me as PCP however Lab notes from last visit: Metabolic profile looks fine. Remember we will want to check a sodium level in 3-4 weeks - this is needed due to addition of trazodone for sleep. I have ordered this test for you; please come by for a lab visit only before you move! A1c (average blood sugar) is normal Cholesterol looks great Thyroid is normal- we can continue current dose of thyroid replacement medication Vitamin D level is fine Your b12 is a bit high- however this should not harm you. You can continue your vitamin B12 supplement.  My only concern is that your red cell counts have dropped since last check for some reason. I would like to recheck this as  well when you come in for your sodium recheck. In addition, I am including stool cards for you to use at home- I want to make sure you are not losing any blood in your stool Take care! Let's plan to visit in 4 months or so  He and his wife have been able to find a place to live near [facility]- they are moving there as  Trazodone-  He does feel like this is helping with his sleep. He does not take it if he has to be up early the next day He does have his stool cards and will do these, but did not get around to it yet.   We will also repeat his CBC today  We did try titrating up on his gabapentin at last visit- he does feel like his shoulder and joints are a bit better  They have moved to [facility] and are adjusting well to the new place- they are still coming into [place] at times and plan to continue to see me at least for now He continues to notice leg cramps in his bilateral legs and hands He will sometimes go to the flea market and will be out most of the day-  Sometimes after being out for several hours he has such bad pain that he can barely get home  He will try hydrating but it does not seem to  help   He is not taking furosemide any longer.  He will get a bit of leg swelling but not severe enough to need medication  He has had LEFT shoulder issues for years, but this is getting worse He cannot reach behind himself and it is getting harder to use his arm for daily needs The shoulder is very painful when he lies in bed as well   He had a RIGHT shoulder replacement in the past - we think in 2015 or so Pt wonders about a muscle relaxer Lab Results  Component Value Date   TSH 1.28 08/17/2017     Chemistry      Component Value Date/Time   NA 140 08/17/2017 1318   K 4.2 08/17/2017 1318   CL 110 08/17/2017 1318   CO2 24 08/17/2017 1318   BUN 21 08/17/2017 1318   CREATININE 0.96 08/17/2017 1318      Component Value Date/Time   CALCIUM 8.7 08/17/2017 1318   ALKPHOS 116 03/05/2017  1103   AST 21 03/05/2017 1103   ALT 13 03/05/2017 1103   BILITOT 0.5 03/05/2017 1103       Patient Active Problem List   Diagnosis Date Noted  . Gastroesophageal reflux disease without esophagitis 06/17/2016  . Other allergic rhinitis 06/17/2016  . Current use of beta blocker 06/17/2016  . Rheumatoid arthritis involving right shoulder ([place]) 06/17/2016  . Complications of gastric bypass surgery 06/17/2016  . Lower extremity edema 03/26/2016  . Mild diastolic dysfunction 03/25/3233  . Hypothyroidism due to acquired atrophy of thyroid 11/14/2015  . Glaucoma 11/14/2015  . Leg weakness, bilateral 11/14/2015  . History of back injury 11/14/2015  . Bipolar affective disorder in remission ([place]) 11/14/2015    Past Medical History:  Diagnosis Date  . Depression   . Eating disorder   . Elevated blood pressure reading   . Frequent headaches   . Glaucoma   . H/O blood clots    associated with an injury per patient  . Hay fever   . Kidney stone   . Phlebitis     Past Surgical History:  Procedure Laterality Date  . APPENDECTOMY  2000  . CHOLECYSTECTOMY  2000  . GASTRIC BYPASS  2000  . GASTRIC BYPASS  2003  . OTHER SURGICAL HISTORY  2001   Reconstructive surgery   . SHOULDER SURGERY      Social History   Tobacco Use  . Smoking status: Former Smoker    Types: Cigarettes    Last attempt to quit: 03/17/1976    Years since quitting: 41.4  . Smokeless tobacco: Former Systems developer    Types: Chew  Substance Use Topics  . Alcohol use: Yes    Comment: occasional beer  . Drug use: No    Family History  Problem Relation Age of Onset  . Arthritis Mother   . Hyperlipidemia Mother   . Heart disease Mother   . Hypertension Mother   . Alcohol abuse Mother   . Heart attack Mother   . Hyperlipidemia Father   . Heart disease Father   . Hypertension Father   . Alcohol abuse Sister   . Other Sister        Brain tumor  . Alcohol abuse Brother   . Other Brother        Brain tumor  .  Ovarian cancer Paternal Aunt   . Diabetes Paternal Aunt   . Stroke Paternal Grandmother   . Asthma Neg Hx   . Eczema Neg  Hx   . Urticaria Neg Hx   . Immunodeficiency Neg Hx   . Angioedema Neg Hx   . Allergic rhinitis Neg Hx     No Known Allergies  Medication list has been reviewed and updated.  Current Outpatient Medications on File Prior to Visit  Medication Sig Dispense Refill  . allopurinol (ZYLOPRIM) 300 MG tablet TAKE 1 TABLET BY MOUTH  DAILY 90 tablet 0  . aspirin 81 MG tablet Take 81 mg by mouth daily.    . dorzolamide-timolol (COSOPT) 22.3-6.8 MG/ML ophthalmic solution Place 1 drop into both eyes 2 (two) times daily.    . fluticasone (FLONASE) 50 MCG/ACT nasal spray Place 2 sprays into both nostrils at bedtime. 16 g 5  . furosemide (LASIX) 20 MG tablet Take 1 tablet (20 mg total) by mouth daily. 90 tablet 3  . gabapentin (NEURONTIN) 600 MG tablet Take 2 tablets (1,200 mg total) by mouth 3 (three) times daily. 540 tablet 2  . latanoprost (XALATAN) 0.005 % ophthalmic solution Place 1 drop into both eyes daily.    Marland Kitchen levothyroxine (SYNTHROID, LEVOTHROID) 50 MCG tablet Take 1 tablet (50 mcg total) by mouth daily before breakfast. 30 tablet 4  . omeprazole (PRILOSEC) 40 MG capsule TAKE 1 CAPSULE BY MOUTH  DAILY 90 capsule 0  . primidone (MYSOLINE) 50 MG tablet 2 pills by mouth at bedtime 180 tablet 3  . propranolol (INDERAL) 10 MG tablet TAKE 1 TABLET BY MOUTH TWICE DAILY 180 tablet 0  . tamsulosin (FLOMAX) 0.4 MG CAPS capsule Take 1 capsule (0.4 mg total) by mouth daily. 90 capsule 3  . traZODone (DESYREL) 50 MG tablet Take 0.5-1 tablets (25-50 mg total) by mouth at bedtime as needed for sleep. 30 tablet 3  . venlafaxine XR (EFFEXOR-XR) 75 MG 24 hr capsule Take 3 capsules (225 mg total) by mouth daily. 270 capsule 3  . vitamin B-12 (CYANOCOBALAMIN) 500 MCG tablet Take 1 tablet (500 mcg total) by mouth daily. 30 tablet 3  . Vitamin D, Ergocalciferol, (DRISDOL) 50000 units CAPS  capsule Take 2x a week 30 capsule 3   No current facility-administered medications on file prior to visit.     Review of Systems:  As per HPI- otherwise negative. No fever or chills No CP or SOB   Physical Examination: Vitals:   08/17/17 1249  BP: 128/80  Pulse: 78  Resp: 16  SpO2: 98%   Vitals:   08/17/17 1249  Weight: 220 lb (99.8 kg)  Height: 5\' 10"  (1.778 m)   Body mass index is 31.57 kg/m. Ideal Body Weight: Weight in (lb) to have BMI = 25: 173.9  GEN: WDWN, NAD, Non-toxic, A & O x 3, looks well, accompanied by his wife today as per usual HEENT: Atraumatic, Normocephalic. Neck supple. No masses, No LAD.  Bilateral TM wnl, oropharynx normal.  PEERL,EOMI.   Ears and Nose: No external deformity. CV: RRR, No M/G/R. No JVD. No thrill. No extra heart sounds. PULM: CTA B, no wheezes, crackles, rhonchi. No retractions. No resp. distress. No accessory muscle use. ABD: S, NT, ND, +BS. No rebound. No HSM. EXTR: No c/c/e NEURO Normal gait.  PSYCH: Normally interactive. Conversant. Not depressed or anxious appearing.  Calm demeanor.  Left shoulder displays very poor active ROM- he can flex and abduct to about 90.  Internal rotation is also very poos   Assessment and Plan: Anemia, unspecified type - Plan: CBC  Hypothyroidism, unspecified type - Plan: TSH  Glaucoma of both eyes, unspecified glaucoma  type  Medication monitoring encounter - Plan: Basic metabolic panel  Chronic left shoulder pain - Plan: methocarbamol (ROBAXIN) 500 MG tablet, Ambulatory referral to Orthopedic Surgery  Discussed some home remedies that he might try for cramps Check thyroid Referral to an appropriate orthopedic specialist near his home in [place] for his shoulder rx for robaxin to use as needed for shoulder pain  Meds ordered this encounter  Medications  . methocarbamol (ROBAXIN) 500 MG tablet    Sig: Take 1 tablet (500 mg total) by mouth every 8 (eight) hours as needed for muscle  spasms.    Dispense:  60 tablet    Refill:  1     Signed Lamar Blinks, MD  Received his labs Results for orders placed or performed in visit on 83/41/96  Basic metabolic panel  Result Value Ref Range   Sodium 140 135 - 145 mEq/L   Potassium 4.2 3.5 - 5.1 mEq/L   Chloride 110 96 - 112 mEq/L   CO2 24 19 - 32 mEq/L   Glucose, Bld 49 (LL) 70 - 99 mg/dL   BUN 21 6 - 23 mg/dL   Creatinine, Ser 0.96 0.40 - 1.50 mg/dL   Calcium 8.7 8.4 - 10.5 mg/dL   GFR 83.63 >60.00 mL/min  CBC  Result Value Ref Range   WBC 3.1 (L) 4.0 - 10.5 K/uL   RBC 3.69 (L) 4.22 - 5.81 Mil/uL   Platelets 171.0 150.0 - 400.0 K/uL   Hemoglobin 10.1 (L) 13.0 - 17.0 g/dL   HCT 30.8 (L) 39.0 - 52.0 %   MCV 83.5 78.0 - 100.0 fl   MCHC 32.8 30.0 - 36.0 g/dL   RDW 18.1 (H) 11.5 - 15.5 %  TSH  Result Value Ref Range   TSH 1.28 0.35 - 4.50 uIU/mL    Called and spoke with his wife about critical glucose- Bryan Hodges had been fasting, he since ate and feels fine.  Advised that he does not need to fast for visits with me in the future since his glucose did drop today  Also suspect that glucose was somewhat artificially low as blood was tested a few hours after draw  Called pt to discuss labs in more detail 6/5-  He has not noted any sx of hypoglycemia and is advised to check his glucose with home meter if she is not feeling well Went over his CBC- he is not aware of any prior history of anemia. We did do stool cards this spring, and he had a colon in 2016. Offered to repeat in 6 months vs referral to hematology. He would prefer to recheck in 6 months, but if not better then consider referral tsh is in range

## 2017-08-17 ENCOUNTER — Encounter: Payer: Self-pay | Admitting: Family Medicine

## 2017-08-17 ENCOUNTER — Ambulatory Visit (INDEPENDENT_AMBULATORY_CARE_PROVIDER_SITE_OTHER): Payer: Medicare Other | Admitting: Family Medicine

## 2017-08-17 ENCOUNTER — Telehealth: Payer: Self-pay | Admitting: *Deleted

## 2017-08-17 VITALS — BP 128/80 | HR 78 | Resp 16 | Ht 70.0 in | Wt 220.0 lb

## 2017-08-17 DIAGNOSIS — D649 Anemia, unspecified: Secondary | ICD-10-CM

## 2017-08-17 DIAGNOSIS — E039 Hypothyroidism, unspecified: Secondary | ICD-10-CM | POA: Diagnosis not present

## 2017-08-17 DIAGNOSIS — H409 Unspecified glaucoma: Secondary | ICD-10-CM | POA: Diagnosis not present

## 2017-08-17 DIAGNOSIS — G8929 Other chronic pain: Secondary | ICD-10-CM | POA: Diagnosis not present

## 2017-08-17 DIAGNOSIS — M25512 Pain in left shoulder: Secondary | ICD-10-CM | POA: Diagnosis not present

## 2017-08-17 DIAGNOSIS — Z5181 Encounter for therapeutic drug level monitoring: Secondary | ICD-10-CM | POA: Diagnosis not present

## 2017-08-17 LAB — BASIC METABOLIC PANEL
BUN: 21 mg/dL (ref 6–23)
CO2: 24 mEq/L (ref 19–32)
Calcium: 8.7 mg/dL (ref 8.4–10.5)
Chloride: 110 mEq/L (ref 96–112)
Creatinine, Ser: 0.96 mg/dL (ref 0.40–1.50)
GFR: 83.63 mL/min (ref >60.00)
GLUCOSE: 49 mg/dL — AB (ref 70–99)
Potassium: 4.2 mEq/L (ref 3.5–5.1)
SODIUM: 140 meq/L (ref 135–145)

## 2017-08-17 LAB — CBC
HCT: 30.8 % — ABNORMAL LOW (ref 39.0–52.0)
Hemoglobin: 10.1 g/dL — ABNORMAL LOW (ref 13.0–17.0)
MCHC: 32.8 g/dL (ref 30.0–36.0)
MCV: 83.5 fl (ref 78.0–100.0)
PLATELETS: 171 10*3/uL (ref 150.0–400.0)
RBC: 3.69 Mil/uL — ABNORMAL LOW (ref 4.22–5.81)
RDW: 18.1 % — ABNORMAL HIGH (ref 11.5–15.5)
WBC: 3.1 10*3/uL — AB (ref 4.0–10.5)

## 2017-08-17 LAB — TSH: TSH: 1.28 u[IU]/mL (ref 0.35–4.50)

## 2017-08-17 MED ORDER — METHOCARBAMOL 500 MG PO TABS: 500.0000 mg | ORAL_TABLET | Freq: Three times a day (TID) | ORAL | 1 refills | Status: DC | PRN

## 2017-08-17 NOTE — Telephone Encounter (Signed)
CRITICAL VALUE STICKER  CRITICAL VALUE:Glucose:49  RECEIVER (on-site recipient of call):Kristy  DATE & TIME NOTIFIED:08/17/17 @ 4:52pm  MESSENGER (representative from lab):Hope  MD NOTIFIED:Dr. Copland   TIME OF NOTIFICATION: 4:53pm RESPONSE:Note with critical results sent to the Dr. Copland.//AB/CMA

## 2017-08-17 NOTE — Patient Instructions (Addendum)
Some home remedies that may help with your cramps include tonic water, pickle juice, gatorade, yellow mustard  I will look for any cause of your cramps in your labs today  Also, I gave you some robaxin (muscle relaxer) to try for the pain in your shoulder and your cramps. This can make you drowsy so please be cautious  I will set you up with orthopedics in [facility]- it looks like Dr. Oren Section with [facility] would be a good option for you Address: [address], [place], [place] [address]  Phone: [phone]  I will be in touch with your labs asap

## 2017-08-20 DIAGNOSIS — M25512 Pain in left shoulder: Secondary | ICD-10-CM | POA: Diagnosis not present

## 2017-08-23 ENCOUNTER — Other Ambulatory Visit: Payer: Self-pay | Admitting: Family Medicine

## 2017-09-01 ENCOUNTER — Other Ambulatory Visit: Payer: Self-pay | Admitting: Family Medicine

## 2017-09-01 DIAGNOSIS — F5101 Primary insomnia: Secondary | ICD-10-CM

## 2017-09-07 ENCOUNTER — Other Ambulatory Visit: Payer: Self-pay | Admitting: Family Medicine

## 2017-09-07 DIAGNOSIS — R200 Anesthesia of skin: Secondary | ICD-10-CM

## 2017-09-08 DIAGNOSIS — H401133 Primary open-angle glaucoma, bilateral, severe stage: Secondary | ICD-10-CM | POA: Diagnosis not present

## 2017-09-08 DIAGNOSIS — H26493 Other secondary cataract, bilateral: Secondary | ICD-10-CM | POA: Diagnosis not present

## 2017-09-15 DIAGNOSIS — H26491 Other secondary cataract, right eye: Secondary | ICD-10-CM | POA: Diagnosis not present

## 2017-09-30 ENCOUNTER — Other Ambulatory Visit: Payer: Self-pay | Admitting: Family Medicine

## 2017-09-30 DIAGNOSIS — F5101 Primary insomnia: Secondary | ICD-10-CM

## 2017-10-16 ENCOUNTER — Other Ambulatory Visit: Payer: Self-pay | Admitting: Family Medicine

## 2017-10-16 DIAGNOSIS — M25512 Pain in left shoulder: Principal | ICD-10-CM

## 2017-10-16 DIAGNOSIS — G8929 Other chronic pain: Secondary | ICD-10-CM

## 2017-11-04 ENCOUNTER — Telehealth: Payer: Self-pay | Admitting: Family Medicine

## 2017-11-04 MED ORDER — FLUTICASONE PROPIONATE 50 MCG/ACT NA SUSP: 2.0000 | Freq: Every day | NASAL | 5 refills | Status: DC

## 2017-11-04 NOTE — Telephone Encounter (Signed)
Flonase refill LOV 08/17/17 Last refill was 12/31/16 with 5 refills Dr. Lamar Blinks PCP [facility] mail pharmacy Approved and refilled today

## 2017-11-04 NOTE — Telephone Encounter (Signed)
Copied from [place] [id number]. Topic: Quick Communication - Rx Refill/Question >> Nov 04, 2017  9:27 AM Synthia Innocent wrote: Medication: fluticasone (FLONASE) 50 MCG/ACT nasal spray  Has the patient contacted their pharmacy? Yes.   (Agent: If no, request that the patient contact the pharmacy for the refill.) (Agent: If yes, when and what did the pharmacy advise?)  Preferred Pharmacy (with phone number or street name): [facility]  Agent: Please be advised that RX refills may take up to 3 business days. We ask that you follow-up with your pharmacy.

## 2017-11-09 ENCOUNTER — Other Ambulatory Visit: Payer: Self-pay | Admitting: Family Medicine

## 2017-12-01 DIAGNOSIS — H401133 Primary open-angle glaucoma, bilateral, severe stage: Secondary | ICD-10-CM | POA: Diagnosis not present

## 2017-12-01 DIAGNOSIS — H26491 Other secondary cataract, right eye: Secondary | ICD-10-CM | POA: Diagnosis not present

## 2018-01-28 ENCOUNTER — Other Ambulatory Visit: Payer: Self-pay | Admitting: Family Medicine

## 2018-01-28 DIAGNOSIS — R200 Anesthesia of skin: Secondary | ICD-10-CM

## 2018-04-07 ENCOUNTER — Ambulatory Visit: Payer: Medicare Other | Admitting: *Deleted

## 2018-04-10 ENCOUNTER — Other Ambulatory Visit: Payer: Self-pay | Admitting: Family Medicine

## 2018-04-10 DIAGNOSIS — F5101 Primary insomnia: Secondary | ICD-10-CM

## 2018-04-14 ENCOUNTER — Encounter: Payer: Self-pay | Admitting: Family Medicine

## 2018-04-14 ENCOUNTER — Ambulatory Visit (INDEPENDENT_AMBULATORY_CARE_PROVIDER_SITE_OTHER): Payer: Medicare Other | Admitting: Family Medicine

## 2018-04-14 ENCOUNTER — Ambulatory Visit (HOSPITAL_BASED_OUTPATIENT_CLINIC_OR_DEPARTMENT_OTHER)
Admission: RE | Admit: 2018-04-14 | Discharge: 2018-04-14 | Disposition: A | Discharge status: Home / Self Care | Payer: Medicare Other | Source: Ambulatory Visit | Attending: Family Medicine | Admitting: Family Medicine

## 2018-04-14 VITALS — BP 142/80 | HR 76 | Temp 98.4°F | Resp 16 | Ht 70.0 in | Wt 206.0 lb

## 2018-04-14 DIAGNOSIS — M25511 Pain in right shoulder: Secondary | ICD-10-CM | POA: Diagnosis not present

## 2018-04-14 DIAGNOSIS — S9031XA Contusion of right foot, initial encounter: Secondary | ICD-10-CM

## 2018-04-14 DIAGNOSIS — S20211A Contusion of right front wall of thorax, initial encounter: Secondary | ICD-10-CM

## 2018-04-14 DIAGNOSIS — S2241XA Multiple fractures of ribs, right side, initial encounter for closed fracture: Secondary | ICD-10-CM | POA: Diagnosis not present

## 2018-04-14 DIAGNOSIS — Z23 Encounter for immunization: Secondary | ICD-10-CM

## 2018-04-14 DIAGNOSIS — W19XXXA Unspecified fall, initial encounter: Secondary | ICD-10-CM

## 2018-04-14 DIAGNOSIS — Z471 Aftercare following joint replacement surgery: Secondary | ICD-10-CM | POA: Diagnosis not present

## 2018-04-14 DIAGNOSIS — Y92009 Unspecified place in unspecified non-institutional (private) residence as the place of occurrence of the external cause: Secondary | ICD-10-CM

## 2018-04-14 DIAGNOSIS — Z96611 Presence of right artificial shoulder joint: Secondary | ICD-10-CM | POA: Diagnosis not present

## 2018-04-14 DIAGNOSIS — S4991XA Unspecified injury of right shoulder and upper arm, initial encounter: Secondary | ICD-10-CM | POA: Diagnosis not present

## 2018-04-14 DIAGNOSIS — S298XXA Other specified injuries of thorax, initial encounter: Secondary | ICD-10-CM

## 2018-04-14 DIAGNOSIS — S99921A Unspecified injury of right foot, initial encounter: Secondary | ICD-10-CM | POA: Diagnosis not present

## 2018-04-14 NOTE — Patient Instructions (Addendum)
You do have 2 broken ribs; they cannot say for sure if these are new or old, but I suspect these occurred when you fell These will heal, but may be painful Be sure to take good deep breaths several times a day to help prevent pneumonia If you develop any cough or fever, please seek care or call me!  I am also going to refer you to see an orthopedist about your shoulder - [facility] in [place]; they are located at  [address] [phone]  We put in a referral but I would encourage you to reach out to them directly as well so we can get you seen asap
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
## 2018-05-16 NOTE — Progress Notes (Addendum)
[place] at [facility] [place], [place], [place] [address] [phone]  Date:  05/19/2018   Name:  Bryan Hodges   DOB:  03-04-1951   MRN:  [id number]  PCP:  Darreld Mclean, MD    Chief Complaint: Mood Swings (medication check)   History of Present Illness:  Bryan Hodges is a 68 y.o. very pleasant male patient who presents with the following:  Patient who I last saw in January after a fall at home-he had injured his shoulder and ribs, we did x-rays and found that he had 2 rib fractures He has history of hypothyroidism, glaucoma, gastric bypass, rheumatoid arthritis.  Also at our last visit, Bryan Hodges confided that his wife had recently left him.  He was quite surprised by this His wife however is actually with him here today.  I did not asked directly but it appears that they are back together or at least on speaking terms  Bryan Hodges does have history of mental illness.  His history lists both depression and bipolar disorder- he reports being dx with "possible" bipolar by a doctor in the past  He notes that he tends to have up and down moods  He has not been to see psychiatry in some time  We have him on effexor and also trazodone right now, as well as gabapentin He states no SI  We need to follow-up with his anemia today-first noticed in December 18.  His colonoscopy is up-to-date, 2016, and stool cards in February 2019 were negative He was again anemic in June 2019, I offered hematology referral but he preferred a 77-month recheck. We will repeat his blood counts today, also ferritin, B12 as he is status post gastric bypass  His ribs are getting better but not yet 10% He needs to see ortho in [place] near where he lives for his right shoulder   He notes that he is not able to get an erection any longer.  He is not sure if this is due to his mediations, or if it could be related to his back  He notes a feeling of "a burning coal all  the time in my spine" He is also losing sensation in his hands   He has been to see neurology, neurosurgery in the past  He was seen in the movement disorders clinic- they had talked about a deep brain stimulator but he is not really interested in this right now  He saw neurosurgery in the past but they did not suggest surgery at that time- this was in 2018 He has also not been interested in a pain pump He adamantly refused narcotics   He is taking aleve every day right now.  Would like to try celebrex  Patient Active Problem List   Diagnosis Date Noted  . Gastroesophageal reflux disease without esophagitis 06/17/2016  . Other allergic rhinitis 06/17/2016  . Current use of beta blocker 06/17/2016  . Rheumatoid arthritis involving right shoulder ([place]) 06/17/2016  . Complications of gastric bypass surgery 06/17/2016  . Lower extremity edema 03/26/2016  . Mild diastolic dysfunction 05/69/7948  . Hypothyroidism due to acquired atrophy of thyroid 11/14/2015  . Glaucoma 11/14/2015  . Leg weakness, bilateral 11/14/2015  . History of back injury 11/14/2015  . Bipolar affective disorder in remission ([place]) 11/14/2015    Past Medical History:  Diagnosis Date  . Depression   . Eating disorder   . Elevated blood pressure reading   .  Frequent headaches   . Glaucoma   . H/O blood clots    associated with an injury per patient  . Hay fever   . Kidney stone   . Phlebitis     Past Surgical History:  Procedure Laterality Date  . APPENDECTOMY  2000  . CHOLECYSTECTOMY  2000  . GASTRIC BYPASS  2000  . GASTRIC BYPASS  2003  . OTHER SURGICAL HISTORY  2001   Reconstructive surgery   . SHOULDER SURGERY      Social History   Tobacco Use  . Smoking status: Former Smoker    Types: Cigarettes    Last attempt to quit: 03/17/1976    Years since quitting: 42.2  . Smokeless tobacco: Former Systems developer    Types: Chew  Substance Use Topics  . Alcohol use: Yes    Comment: occasional beer  . Drug  use: No    Family History  Problem Relation Age of Onset  . Arthritis Mother   . Hyperlipidemia Mother   . Heart disease Mother   . Hypertension Mother   . Alcohol abuse Mother   . Heart attack Mother   . Hyperlipidemia Father   . Heart disease Father   . Hypertension Father   . Alcohol abuse Sister   . Other Sister        Brain tumor  . Alcohol abuse Brother   . Other Brother        Brain tumor  . Ovarian cancer Paternal Aunt   . Diabetes Paternal Aunt   . Stroke Paternal Grandmother   . Asthma Neg Hx   . Eczema Neg Hx   . Urticaria Neg Hx   . Immunodeficiency Neg Hx   . Angioedema Neg Hx   . Allergic rhinitis Neg Hx     No Known Allergies  Medication list has been reviewed and updated.  Current Outpatient Medications on File Prior to Visit  Medication Sig Dispense Refill  . allopurinol (ZYLOPRIM) 300 MG tablet TAKE 1 TABLET BY MOUTH  DAILY 90 tablet 1  . aspirin 81 MG tablet Take 81 mg by mouth daily.    . dorzolamide-timolol (COSOPT) 22.3-6.8 MG/ML ophthalmic solution Place 1 drop into both eyes 2 (two) times daily.    . fluticasone (FLONASE) 50 MCG/ACT nasal spray Place 2 sprays into both nostrils at bedtime. 16 g 5  . furosemide (LASIX) 20 MG tablet Take 1 tablet (20 mg total) by mouth daily. 90 tablet 3  . gabapentin (NEURONTIN) 600 MG tablet TAKE 2 TABLETS BY MOUTH 3  TIMES DAILY 540 tablet 1  . latanoprost (XALATAN) 0.005 % ophthalmic solution Place 1 drop into both eyes daily.    Marland Kitchen levothyroxine (SYNTHROID, LEVOTHROID) 50 MCG tablet TAKE 1 TABLET BY MOUTH EVERY DAY BEFORE BREAKFAST 30 tablet 5  . methocarbamol (ROBAXIN) 500 MG tablet TAKE 1 TABLET(500 MG) BY MOUTH EVERY 8 HOURS AS NEEDED FOR MUSCLE SPASMS 90 tablet 1  . omeprazole (PRILOSEC) 40 MG capsule TAKE 1 CAPSULE BY MOUTH  DAILY 90 capsule 1  . primidone (MYSOLINE) 50 MG tablet 2 pills by mouth at bedtime 180 tablet 3  . propranolol (INDERAL) 10 MG tablet TAKE 1 TABLET BY MOUTH TWICE DAILY 180 tablet 0   . tamsulosin (FLOMAX) 0.4 MG CAPS capsule TAKE 1 CAPSULE BY MOUTH  DAILY 90 capsule 1  . traZODone (DESYREL) 50 MG tablet TAKE 1/2 TO 1 TABLET BY MOUTH AT BEDTIME AS NEEDED FOR SLEEP 90 tablet 2  . traZODone (DESYREL) 50  MG tablet TAKE 1/2 TO 1 TABLET BY MOUTH AT BEDTIME AS NEEDED FOR SLEEP 30 tablet 0  . venlafaxine XR (EFFEXOR-XR) 75 MG 24 hr capsule Take 3 capsules (225 mg total) by mouth daily. 270 capsule 3  . vitamin B-12 (CYANOCOBALAMIN) 500 MCG tablet Take 1 tablet (500 mcg total) by mouth daily. 30 tablet 3  . Vitamin D, Ergocalciferol, (DRISDOL) 50000 units CAPS capsule Take 2x a week 30 capsule 3   No current facility-administered medications on file prior to visit.     Review of Systems:  As per HPI- otherwise negative.   Physical Examination: Vitals:   05/19/18 1107  BP: 118/80  Pulse: 72  Resp: 16  SpO2: 98%   Vitals:   05/19/18 1107  Weight: 205 lb (93 kg)  Height: 5\' 10"  (1.778 m)   Body mass index is 29.41 kg/m. Ideal Body Weight: Weight in (lb) to have BMI = 25: 173.9  GEN: WDWN, NAD, Non-toxic, A & O x 3, mild overweight, looks well HEENT: Atraumatic, Normocephalic. Neck supple. No masses, No LAD. Ears and Nose: No external deformity. CV: RRR, No M/G/R. No JVD. No thrill. No extra heart sounds. PULM: CTA B, no wheezes, crackles, rhonchi. No retractions. No resp. distress. No accessory muscle use. ABD: S, NT, ND, +BS. No rebound. No HSM. EXTR: No c/c/e NEURO chronically slow and somewhat abnormal gait, as it is baseline for this patient, stooped posture, carries a cane PSYCH: Normally interactive. Conversant. Not depressed or anxious appearing.  Calm demeanor.  Patient had an excess skin removal procedure done, after his weight loss.  This left him with some bands of uncomfortable scar tissue especially under his arms. This contributes to his difficulty in moving his arms, though he also seems to have a partially frozen right shoulder   Assessment and  Plan: Anemia, unspecified type - Plan: CBC, B12, Ferritin  Hypothyroidism, unspecified type - Plan: TSH  History of gastric bypass - Plan: Comprehensive metabolic panel, D62, Vitamin D (25 hydroxy)  Arthralgia, unspecified joint - Plan: Ambulatory referral to Rheumatology, celecoxib (CELEBREX) 200 MG capsule  Erectile dysfunction, unspecified erectile dysfunction type - Plan: Ambulatory referral to Urology   Here today with a few concerns. Encouraged him to schedule an appoint with orthopedics regarding his right shoulder He has history of rheumatoid arthritis, and is not currently seeing a rheumatologist.  Place referral to rheumatology Bryan Hodges has complaint of persistent depression, possible bipolar disorder symptoms.  He is not acutely ill, and has no suicidal ideation.  Explained that I think psychiatry consultation would be appropriate at this time.  They will look for psychiatrist near their home He notes concern of erectile dysfunction.  Urology referral  He also has been noted to have anemia recently.  We will repeat this for him today B12 and vitamin D ordered due to history of gastric bypass He is taking Synthroid 50 mcg, check TSH today   Signed Lamar Blinks, MD  Received his labs as below Call patient regarding very low blood sugar.  See separate phone note  Called to go over the rest of his labs -content of discussion put in letter, as follows  B12 thyroid and vitamin D look fine Your anemia is worse- however this may be due to iron deficiency Your iron- ferritin- is very low  As we discussed, please start on an OTC iron supplement twice a day  65 mg of elemental iron or 325 mg of FeSO4 If you cannot tolerate this- GI  upset, constipation- let me know  As we discussed, your blood sugar is quite low.  I am not sure if this is a real finding, or if your blood sugar may have dropped during blood processing.  Please check your sugar with home meter a few times over the  next couple of weeks.  A normal reading is typically 70 and above.  Let me know what sort of readings you get, please alert me sooner if you are getting persistently low readings  Other details-your alkaline phosphatase level is slightly high.  This is a marker of liver and bone metabolism.  This elevation is mild, may be inconsequential.  We will plan to retreat with next blood draw  Your white blood cell count is stable, low but not alarmingly so.  We will continue to monitor  Also included is an order for labs, please have these drawn locally in about 6 weeks.  I will look for the results.  We can then plan the next step. If your blood counts are not improving with iron, we will need to consider having you see hematology, and/or gastroenterology.     Results for orders placed or performed in visit on 05/19/18  CBC  Result Value Ref Range   WBC 3.1 (L) 4.0 - 10.5 K/uL   RBC 3.73 (L) 4.22 - 5.81 Mil/uL   Platelets 158.0 150.0 - 400.0 K/uL   Hemoglobin 9.5 (L) 13.0 - 17.0 g/dL   HCT 29.9 (L) 39.0 - 52.0 %   MCV 80.1 78.0 - 100.0 fl   MCHC 31.9 30.0 - 36.0 g/dL   RDW 18.6 (H) 11.5 - 15.5 %  Comprehensive metabolic panel  Result Value Ref Range   Sodium 141 135 - 145 mEq/L   Potassium 3.8 3.5 - 5.1 mEq/L   Chloride 108 96 - 112 mEq/L   CO2 28 19 - 32 mEq/L   Glucose, Bld 34 (LL) 70 - 99 mg/dL   BUN 23 6 - 23 mg/dL   Creatinine, Ser 1.05 0.40 - 1.50 mg/dL   Total Bilirubin 0.4 0.2 - 1.2 mg/dL   Alkaline Phosphatase 134 (H) 39 - 117 U/L   AST 20 0 - 37 U/L   ALT 14 0 - 53 U/L   Total Protein 6.0 6.0 - 8.3 g/dL   Albumin 3.8 3.5 - 5.2 g/dL   Calcium 8.4 8.4 - 10.5 mg/dL   GFR 70.79 >60.00 mL/min  TSH  Result Value Ref Range   TSH 1.77 0.35 - 4.50 uIU/mL  B12  Result Value Ref Range   Vitamin B-12 468 211 - 911 pg/mL  Vitamin D (25 hydroxy)  Result Value Ref Range   VITD 48.81 30.00 - 100.00 ng/mL  Ferritin  Result Value Ref Range   Ferritin 6.7 (L) 22.0 - 322.0 ng/mL

## 2018-05-19 ENCOUNTER — Telehealth: Payer: Self-pay | Admitting: Family Medicine

## 2018-05-19 ENCOUNTER — Encounter: Payer: Self-pay | Admitting: Family Medicine

## 2018-05-19 ENCOUNTER — Ambulatory Visit (INDEPENDENT_AMBULATORY_CARE_PROVIDER_SITE_OTHER): Payer: Medicare Other | Admitting: Family Medicine

## 2018-05-19 ENCOUNTER — Other Ambulatory Visit: Payer: Self-pay | Admitting: Family Medicine

## 2018-05-19 VITALS — BP 118/80 | HR 72 | Resp 16 | Ht 70.0 in | Wt 205.0 lb

## 2018-05-19 DIAGNOSIS — Z9884 Bariatric surgery status: Secondary | ICD-10-CM

## 2018-05-19 DIAGNOSIS — D649 Anemia, unspecified: Secondary | ICD-10-CM | POA: Diagnosis not present

## 2018-05-19 DIAGNOSIS — M2550 Pain in unspecified joint: Secondary | ICD-10-CM

## 2018-05-19 DIAGNOSIS — E039 Hypothyroidism, unspecified: Secondary | ICD-10-CM

## 2018-05-19 DIAGNOSIS — N529 Male erectile dysfunction, unspecified: Secondary | ICD-10-CM

## 2018-05-19 LAB — CBC
HCT: 29.9 % — ABNORMAL LOW (ref 39.0–52.0)
Hemoglobin: 9.5 g/dL — ABNORMAL LOW (ref 13.0–17.0)
MCHC: 31.9 g/dL (ref 30.0–36.0)
MCV: 80.1 fl (ref 78.0–100.0)
Platelets: 158 10*3/uL (ref 150.0–400.0)
RBC: 3.73 Mil/uL — ABNORMAL LOW (ref 4.22–5.81)
RDW: 18.6 % — ABNORMAL HIGH (ref 11.5–15.5)
WBC: 3.1 10*3/uL — ABNORMAL LOW (ref 4.0–10.5)

## 2018-05-19 LAB — COMPREHENSIVE METABOLIC PANEL
ALT: 14 U/L (ref 0–53)
AST: 20 U/L (ref 0–37)
Albumin: 3.8 g/dL (ref 3.5–5.2)
Alkaline Phosphatase: 134 U/L — ABNORMAL HIGH (ref 39–117)
BUN: 23 mg/dL (ref 6–23)
CHLORIDE: 108 meq/L (ref 96–112)
CO2: 28 mEq/L (ref 19–32)
CREATININE: 1.05 mg/dL (ref 0.40–1.50)
Calcium: 8.4 mg/dL (ref 8.4–10.5)
GFR: 70.79 mL/min (ref >60.00)
Glucose, Bld: 34 mg/dL — CL (ref 70–99)
Potassium: 3.8 mEq/L (ref 3.5–5.1)
Sodium: 141 mEq/L (ref 135–145)
Total Bilirubin: 0.4 mg/dL (ref 0.2–1.2)
Total Protein: 6 g/dL (ref 6.0–8.3)

## 2018-05-19 LAB — FERRITIN: Ferritin: 6.7 ng/mL — ABNORMAL LOW (ref 22.0–322.0)

## 2018-05-19 LAB — TSH: TSH: 1.77 u[IU]/mL (ref 0.35–4.50)

## 2018-05-19 LAB — VITAMIN B12: Vitamin B-12: 468 pg/mL (ref 211–911)

## 2018-05-19 LAB — VITAMIN D 25 HYDROXY (VIT D DEFICIENCY, FRACTURES): VITD: 48.81 ng/mL (ref 30.00–100.00)

## 2018-05-19 MED ORDER — CELECOXIB 200 MG PO CAPS: 200.0000 mg | ORAL_CAPSULE | Freq: Two times a day (BID) | ORAL | 6 refills | Status: DC

## 2018-05-19 NOTE — Telephone Encounter (Signed)
He can use his wife's glucose meter

## 2018-05-19 NOTE — Telephone Encounter (Signed)
Received alert about critically low glucose  Lab Results  Component Value Date   HGBA1C 5.6 03/05/2017   He does not have diabetes, is not on any blood sugar lowering medications  Called and LMOM on his wife's cell and at pt home number  Was able to speak with him, he is feeling okay He has not eaten however since our visit Advised him to eat a meal right away We have noted low blood sugar in the past, likely related to his gastric bypass surgery I advised him to eat something every 3-4 hours on a consistent basis, and to include regular carbs in his diet He also will start checking his blood sugar a few times a week, and will let me know what he gets

## 2018-05-19 NOTE — Patient Instructions (Addendum)
Please call the orthopedics office and re-schedule  [facility] in [place];  [place]  [phone]  I also put in a referral to a rheumatology office in [place]  I would recommend that you see a psychiatrist who is convenient to you.  Generally the patient must reach out to psychiatry- we do not schedule you.  Please let me know if your desired provider does need a referral  I will be in touch with your labs asap   We will try celebrex for your body pain - take this OR aleve, not both   Perhaps try compression socks for your leg swelling

## 2018-05-28 ENCOUNTER — Other Ambulatory Visit: Payer: Self-pay

## 2018-05-28 DIAGNOSIS — F5101 Primary insomnia: Secondary | ICD-10-CM

## 2018-05-28 MED ORDER — FUROSEMIDE 20 MG PO TABS: 20.0000 mg | ORAL_TABLET | Freq: Every day | ORAL | 3 refills | Status: DC

## 2018-05-28 MED ORDER — VENLAFAXINE HCL ER 75 MG PO CP24: 225.0000 mg | ORAL_CAPSULE | Freq: Every day | ORAL | 3 refills | Status: DC

## 2018-05-28 MED ORDER — TAMSULOSIN HCL 0.4 MG PO CAPS: 0.4000 mg | ORAL_CAPSULE | Freq: Every day | ORAL | 1 refills | Status: DC

## 2018-05-28 MED ORDER — TRAZODONE HCL 50 MG PO TABS: 25.0000 mg | ORAL_TABLET | Freq: Every evening | ORAL | 2 refills | Status: DC | PRN

## 2018-05-28 MED ORDER — FLUTICASONE PROPIONATE 50 MCG/ACT NA SUSP: 2.0000 | Freq: Every day | NASAL | 5 refills | Status: DC

## 2018-06-01 DIAGNOSIS — H401133 Primary open-angle glaucoma, bilateral, severe stage: Secondary | ICD-10-CM | POA: Diagnosis not present

## 2018-06-01 DIAGNOSIS — Z961 Presence of intraocular lens: Secondary | ICD-10-CM | POA: Diagnosis not present

## 2018-06-09 DIAGNOSIS — M2550 Pain in unspecified joint: Secondary | ICD-10-CM | POA: Diagnosis not present

## 2018-07-25 ENCOUNTER — Other Ambulatory Visit: Payer: Self-pay | Admitting: Family Medicine

## 2018-07-25 DIAGNOSIS — G250 Essential tremor: Secondary | ICD-10-CM

## 2018-07-25 DIAGNOSIS — R200 Anesthesia of skin: Secondary | ICD-10-CM

## 2018-08-17 ENCOUNTER — Telehealth: Payer: Self-pay

## 2018-08-17 NOTE — Telephone Encounter (Signed)
Copied from [place] [id number]. Topic: Appointment Scheduling - Scheduling Inquiry for Clinic >> Aug 12, 2018 12:41 PM Rainey Pines A wrote: Patient would like to schedule an appointment for back pain. Patient would like a callback at [phone]. If you do not reach him there please call [phone].
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
## 2018-08-17 NOTE — Telephone Encounter (Signed)
Scheduled appt for tomorrow °

## 2018-08-18 ENCOUNTER — Telehealth: Payer: Self-pay | Admitting: *Deleted

## 2018-08-18 ENCOUNTER — Encounter: Payer: Self-pay | Admitting: Family Medicine

## 2018-08-18 ENCOUNTER — Ambulatory Visit: Payer: Medicare Other | Admitting: Family Medicine

## 2018-08-18 ENCOUNTER — Other Ambulatory Visit: Payer: Self-pay

## 2018-08-18 DIAGNOSIS — M5442 Lumbago with sciatica, left side: Secondary | ICD-10-CM

## 2018-08-18 DIAGNOSIS — R29898 Other symptoms and signs involving the musculoskeletal system: Secondary | ICD-10-CM

## 2018-08-18 DIAGNOSIS — G8929 Other chronic pain: Secondary | ICD-10-CM

## 2018-08-18 DIAGNOSIS — N529 Male erectile dysfunction, unspecified: Secondary | ICD-10-CM

## 2018-08-18 NOTE — Addendum Note (Signed)
Addended by: Wynonia Musty A on: 08/18/2018 03:16 PM   Modules accepted: Orders

## 2018-08-18 NOTE — Telephone Encounter (Signed)
Copied from [place] [id number]. Topic: General - Other >> Aug 18, 2018  9:38 AM Rainey Pines A wrote: Jenny Reichmann from [facility] stated that they were sent a referral for patient in March and they now nee the patients medical records pertaining to erectile dysfunction sent over, as well as lab work. Jenny Reichmann would like  records faxed to [phone]. [place] callback number is [phone]
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
## 2018-08-19 ENCOUNTER — Other Ambulatory Visit: Payer: Self-pay

## 2018-08-19 ENCOUNTER — Encounter: Payer: Self-pay | Admitting: Family Medicine

## 2018-08-19 ENCOUNTER — Ambulatory Visit (INDEPENDENT_AMBULATORY_CARE_PROVIDER_SITE_OTHER): Payer: Medicare Other | Admitting: Family Medicine

## 2018-08-19 ENCOUNTER — Other Ambulatory Visit: Payer: Self-pay | Admitting: Family Medicine

## 2018-08-19 ENCOUNTER — Ambulatory Visit (HOSPITAL_COMMUNITY)
Admission: RE | Admit: 2018-08-19 | Discharge: 2018-08-19 | Disposition: A | Discharge status: Home / Self Care | Payer: Medicare Other | Source: Ambulatory Visit | Attending: Family Medicine | Admitting: Family Medicine

## 2018-08-19 ENCOUNTER — Ambulatory Visit (HOSPITAL_BASED_OUTPATIENT_CLINIC_OR_DEPARTMENT_OTHER)
Admission: RE | Admit: 2018-08-19 | Discharge: 2018-08-19 | Disposition: A | Discharge status: Home / Self Care | Payer: Medicare Other | Source: Ambulatory Visit | Attending: Family Medicine | Admitting: Family Medicine

## 2018-08-19 VITALS — BP 108/62 | HR 92 | Temp 97.9°F | Resp 18 | Ht 70.0 in | Wt 211.0 lb

## 2018-08-19 DIAGNOSIS — M5117 Intervertebral disc disorders with radiculopathy, lumbosacral region: Secondary | ICD-10-CM | POA: Diagnosis not present

## 2018-08-19 DIAGNOSIS — M5441 Lumbago with sciatica, right side: Secondary | ICD-10-CM | POA: Diagnosis not present

## 2018-08-19 DIAGNOSIS — R29898 Other symptoms and signs involving the musculoskeletal system: Secondary | ICD-10-CM | POA: Diagnosis not present

## 2018-08-19 DIAGNOSIS — R600 Localized edema: Secondary | ICD-10-CM

## 2018-08-19 DIAGNOSIS — E039 Hypothyroidism, unspecified: Secondary | ICD-10-CM | POA: Diagnosis not present

## 2018-08-19 DIAGNOSIS — M5442 Lumbago with sciatica, left side: Secondary | ICD-10-CM

## 2018-08-19 DIAGNOSIS — N529 Male erectile dysfunction, unspecified: Secondary | ICD-10-CM | POA: Insufficient documentation

## 2018-08-19 DIAGNOSIS — L03116 Cellulitis of left lower limb: Secondary | ICD-10-CM

## 2018-08-19 DIAGNOSIS — G8929 Other chronic pain: Secondary | ICD-10-CM

## 2018-08-19 DIAGNOSIS — M7989 Other specified soft tissue disorders: Secondary | ICD-10-CM

## 2018-08-19 DIAGNOSIS — Z125 Encounter for screening for malignant neoplasm of prostate: Secondary | ICD-10-CM

## 2018-08-19 DIAGNOSIS — M25512 Pain in left shoulder: Secondary | ICD-10-CM

## 2018-08-19 DIAGNOSIS — D649 Anemia, unspecified: Secondary | ICD-10-CM

## 2018-08-19 DIAGNOSIS — Z9884 Bariatric surgery status: Secondary | ICD-10-CM | POA: Diagnosis not present

## 2018-08-19 LAB — COMPREHENSIVE METABOLIC PANEL
ALT: 13 U/L (ref 0–53)
AST: 16 U/L (ref 0–37)
Albumin: 3.6 g/dL (ref 3.5–5.2)
Alkaline Phosphatase: 116 U/L (ref 39–117)
BUN: 29 mg/dL — ABNORMAL HIGH (ref 6–23)
CO2: 25 mEq/L (ref 19–32)
Calcium: 8.1 mg/dL — ABNORMAL LOW (ref 8.4–10.5)
Chloride: 111 mEq/L (ref 96–112)
Creatinine, Ser: 1.25 mg/dL (ref 0.40–1.50)
GFR: 57.84 mL/min — ABNORMAL LOW (ref >60.00)
Glucose, Bld: 57 mg/dL — ABNORMAL LOW (ref 70–99)
Potassium: 4.5 mEq/L (ref 3.5–5.1)
Sodium: 143 mEq/L (ref 135–145)
Total Bilirubin: 0.6 mg/dL (ref 0.2–1.2)
Total Protein: 5.7 g/dL — ABNORMAL LOW (ref 6.0–8.3)

## 2018-08-19 LAB — CBC
HCT: 27.6 % — ABNORMAL LOW (ref 39.0–52.0)
Hemoglobin: 8.8 g/dL — ABNORMAL LOW (ref 13.0–17.0)
MCHC: 31.8 g/dL (ref 30.0–36.0)
MCV: 77.4 fl — ABNORMAL LOW (ref 78.0–100.0)
Platelets: 160 10*3/uL (ref 150.0–400.0)
RBC: 3.57 Mil/uL — ABNORMAL LOW (ref 4.22–5.81)
RDW: 20 % — ABNORMAL HIGH (ref 11.5–15.5)
WBC: 3.3 10*3/uL — ABNORMAL LOW (ref 4.0–10.5)

## 2018-08-19 LAB — PSA: PSA: 0.18 ng/mL (ref 0.10–4.00)

## 2018-08-19 LAB — TSH: TSH: 3.55 u[IU]/mL (ref 0.35–4.50)

## 2018-08-19 LAB — FERRITIN: Ferritin: 6.4 ng/mL — ABNORMAL LOW (ref 22.0–322.0)

## 2018-08-19 LAB — BRAIN NATRIURETIC PEPTIDE: Pro B Natriuretic peptide (BNP): 189 pg/mL — ABNORMAL HIGH (ref 0.0–100.0)

## 2018-08-19 MED ORDER — METHOCARBAMOL 500 MG PO TABS: ORAL_TABLET | ORAL | 1 refills | Status: DC

## 2018-08-19 MED ORDER — CEPHALEXIN 500 MG PO CAPS: 500.0000 mg | ORAL_CAPSULE | Freq: Two times a day (BID) | ORAL | 0 refills | Status: DC

## 2018-08-19 NOTE — Patient Instructions (Addendum)
It was good to see you toyda- I am sorry that you are having such a hard time!   Your MRI is reassuring that you do not have any dangerous nerve compression in your back.  However, I am still concerned by your difficulty walking and weakness of your legs.  Let's have you see neurology in your local area to get their opinion- I have placed a referral to [facility]   We will get an ultrasound of your left leg today to make sure no clot and an x-ray of your hand- please come back about 2 pm to the imaging dept for your x-ray and ultrasound  Assuming your x-ray is ok, let's try a course of antibiotics in case you have any infection in your hand  Let's try keflex twice a day for 10 days  For your back pain, let's use robaxin up to three times a day as needed.  I will also touch base with Dr. Hollace Kinnier to see what he recommends for you  You are seeing urology tomorrow which is great news.  Will print out recent labs for you to take with you  We will check on your anemia and other general labs today

## 2018-08-20 ENCOUNTER — Other Ambulatory Visit: Payer: Self-pay | Admitting: Family Medicine

## 2018-08-20 DIAGNOSIS — D509 Iron deficiency anemia, unspecified: Secondary | ICD-10-CM

## 2018-08-20 DIAGNOSIS — R600 Localized edema: Secondary | ICD-10-CM | POA: Diagnosis not present

## 2018-08-20 MED ORDER — FERROUS SULFATE 325 (65 FE) MG PO TABS: 325.0000 mg | ORAL_TABLET | Freq: Two times a day (BID) | ORAL | 3 refills | Status: DC

## 2018-08-20 NOTE — Addendum Note (Signed)
Addended by: Lamar Blinks C on: 08/20/2018 09:06 AM   Modules accepted: Orders

## 2018-08-20 NOTE — Progress Notes (Signed)
Placed referral to hematology in [place] here he lives  [facility] to set up a LLQ [place] to rule out DVT  Will fax order for [place] to [place] [phone] Staff will call pt and set up [place] appt

## 2018-08-22 ENCOUNTER — Other Ambulatory Visit: Payer: Self-pay | Admitting: Family Medicine

## 2018-08-23 MED ORDER — DOXYCYCLINE HYCLATE 100 MG PO CAPS: 100.0000 mg | ORAL_CAPSULE | Freq: Two times a day (BID) | ORAL | 0 refills | Status: DC

## 2018-08-23 NOTE — Addendum Note (Signed)
Addended by: Lamar Blinks C on: 08/23/2018 02:41 PM   Modules accepted: Orders

## 2018-08-27 DIAGNOSIS — D508 Other iron deficiency anemias: Secondary | ICD-10-CM | POA: Diagnosis not present

## 2018-08-27 DIAGNOSIS — D649 Anemia, unspecified: Secondary | ICD-10-CM | POA: Diagnosis not present

## 2018-09-01 DIAGNOSIS — D508 Other iron deficiency anemias: Secondary | ICD-10-CM | POA: Diagnosis not present

## 2018-09-08 DIAGNOSIS — D508 Other iron deficiency anemias: Secondary | ICD-10-CM | POA: Diagnosis not present

## 2018-09-20 DIAGNOSIS — R29898 Other symptoms and signs involving the musculoskeletal system: Secondary | ICD-10-CM | POA: Diagnosis not present

## 2018-09-20 DIAGNOSIS — R200 Anesthesia of skin: Secondary | ICD-10-CM | POA: Diagnosis not present

## 2018-09-20 DIAGNOSIS — R202 Paresthesia of skin: Secondary | ICD-10-CM | POA: Diagnosis not present

## 2018-09-27 DIAGNOSIS — R29898 Other symptoms and signs involving the musculoskeletal system: Secondary | ICD-10-CM | POA: Diagnosis not present

## 2018-09-27 DIAGNOSIS — M5124 Other intervertebral disc displacement, thoracic region: Secondary | ICD-10-CM | POA: Diagnosis not present

## 2018-09-27 DIAGNOSIS — R531 Weakness: Secondary | ICD-10-CM | POA: Diagnosis not present

## 2018-09-27 DIAGNOSIS — R200 Anesthesia of skin: Secondary | ICD-10-CM | POA: Diagnosis not present

## 2018-10-22 DIAGNOSIS — D649 Anemia, unspecified: Secondary | ICD-10-CM | POA: Diagnosis not present

## 2018-10-22 DIAGNOSIS — D508 Other iron deficiency anemias: Secondary | ICD-10-CM | POA: Diagnosis not present

## 2018-10-23 ENCOUNTER — Other Ambulatory Visit: Payer: Self-pay | Admitting: Family Medicine

## 2018-10-25 DIAGNOSIS — R200 Anesthesia of skin: Secondary | ICD-10-CM | POA: Diagnosis not present

## 2018-10-25 DIAGNOSIS — R29898 Other symptoms and signs involving the musculoskeletal system: Secondary | ICD-10-CM | POA: Diagnosis not present

## 2018-10-25 DIAGNOSIS — R51 Headache: Secondary | ICD-10-CM | POA: Diagnosis not present

## 2018-10-28 DIAGNOSIS — R42 Dizziness and giddiness: Secondary | ICD-10-CM | POA: Diagnosis not present

## 2018-10-28 DIAGNOSIS — R200 Anesthesia of skin: Secondary | ICD-10-CM | POA: Diagnosis not present

## 2018-10-28 DIAGNOSIS — R29898 Other symptoms and signs involving the musculoskeletal system: Secondary | ICD-10-CM | POA: Diagnosis not present

## 2018-11-23 ENCOUNTER — Other Ambulatory Visit: Payer: Self-pay | Admitting: Family Medicine

## 2018-11-23 DIAGNOSIS — M25512 Pain in left shoulder: Secondary | ICD-10-CM

## 2018-11-23 DIAGNOSIS — G8929 Other chronic pain: Secondary | ICD-10-CM

## 2018-11-23 NOTE — Telephone Encounter (Signed)
Copied from [place] [id number]. Topic: Quick Communication - Rx Refill/Question >> Nov 23, 2018  2:55 PM Leward Quan A wrote: Medication: methocarbamol (ROBAXIN) 500 MG tablet   Has the patient contacted their pharmacy? Yes.   (Agent: If no, request that the patient contact the pharmacy for the refill.) (Agent: If yes, when and what did the pharmacy advise?)  Preferred Pharmacy (with phone number or street name): [facility] [place], [place] [address] [phone] (Phone) [phone] (Fax)    Agent: Please be advised that RX refills may take up to 3 business days. We ask that you follow-up with your pharmacy.

## 2018-11-24 MED ORDER — METHOCARBAMOL 500 MG PO TABS: ORAL_TABLET | ORAL | 0 refills | Status: DC

## 2018-11-29 DIAGNOSIS — R29898 Other symptoms and signs involving the musculoskeletal system: Secondary | ICD-10-CM | POA: Diagnosis not present

## 2018-11-29 DIAGNOSIS — M545 Low back pain: Secondary | ICD-10-CM | POA: Diagnosis not present

## 2018-12-05 ENCOUNTER — Other Ambulatory Visit: Payer: Self-pay | Admitting: Family Medicine

## 2018-12-07 DIAGNOSIS — H401133 Primary open-angle glaucoma, bilateral, severe stage: Secondary | ICD-10-CM | POA: Diagnosis not present

## 2018-12-07 DIAGNOSIS — H04123 Dry eye syndrome of bilateral lacrimal glands: Secondary | ICD-10-CM | POA: Diagnosis not present

## 2018-12-08 NOTE — Progress Notes (Addendum)
Virtual Visit via Video Note  I connected with patient on 12/09/18 at  1:00 PM EDT by audio enabled telemedicine application and verified that I am speaking with the correct person using two identifiers.   THIS ENCOUNTER IS A VIRTUAL VISIT DUE TO COVID-19 - PATIENT WAS NOT SEEN IN THE OFFICE. PATIENT HAS CONSENTED TO VIRTUAL VISIT / TELEMEDICINE VISIT   Location of patient: home  Location of provider: office  I discussed the limitations of evaluation and management by telemedicine and the availability of in person appointments. The patient expressed understanding and agreed to proceed.   Subjective:   Bryan Hodges is a 68 y.o. male who presents for Medicare Annual/Subsequent preventive examination.  Review of Systems:  Home Safety/Smoke Alarms: Feels safe in home. Smoke alarms in place.  Lives with wife in 1 story home. Uses cane. Step over tub.  Male:   CCS- reports 6 yrs ago in [place]    PSA-  Lab Results  Component Value Date   PSA 0.18 08/19/2018       Objective:    Vitals: There were no vitals taken for this visit.  There is no height or weight on file to calculate BMI.  Advanced Directives 12/09/2018 04/06/2017 03/20/2016 12/24/2015  Does Patient Have a Medical Advance Directive? No No No No  Would patient like information on creating a medical advance directive? No - Patient declined No - Patient declined Yes (MAU/Ambulatory/Procedural Areas - Information given) -    Tobacco Social History   Tobacco Use  Smoking Status Former Smoker  . Types: Cigarettes  . Quit date: 03/17/1976  . Years since quitting: 42.7  Smokeless Tobacco Former Systems developer  . Types: Chew     Counseling given: Not Answered   Clinical Intake:     Pain : No/denies pain                 Past Medical History:  Diagnosis Date  . Depression   . Eating disorder   . Elevated blood pressure reading   . Frequent headaches   . Glaucoma   . H/O blood clots    associated with an injury per  patient  . Hay fever   . Kidney stone   . Phlebitis    Past Surgical History:  Procedure Laterality Date  . APPENDECTOMY  2000  . CHOLECYSTECTOMY  2000  . GASTRIC BYPASS  2000  . GASTRIC BYPASS  2003  . OTHER SURGICAL HISTORY  2001   Reconstructive surgery   . SHOULDER SURGERY     Family History  Problem Relation Age of Onset  . Arthritis Mother   . Hyperlipidemia Mother   . Heart disease Mother   . Hypertension Mother   . Alcohol abuse Mother   . Heart attack Mother   . Hyperlipidemia Father   . Heart disease Father   . Hypertension Father   . Alcohol abuse Sister   . Other Sister        Brain tumor  . Alcohol abuse Brother   . Other Brother        Brain tumor  . Ovarian cancer Paternal Aunt   . Diabetes Paternal Aunt   . Stroke Paternal Grandmother   . Asthma Neg Hx   . Eczema Neg Hx   . Urticaria Neg Hx   . Immunodeficiency Neg Hx   . Angioedema Neg Hx   . Allergic rhinitis Neg Hx    Social History   Socioeconomic History  . Marital status:  Married    Spouse name: Not on file  . Number of children: Not on file  . Years of education: Not on file  . Highest education level: Not on file  Occupational History  . Not on file  Social Needs  . Financial resource strain: Not on file  . Food insecurity    Worry: Not on file    Inability: Not on file  . Transportation needs    Medical: Not on file    Non-medical: Not on file  Tobacco Use  . Smoking status: Former Smoker    Types: Cigarettes    Quit date: 03/17/1976    Years since quitting: 42.7  . Smokeless tobacco: Former Systems developer    Types: Chew  Substance and Sexual Activity  . Alcohol use: Yes    Comment: occasional beer  . Drug use: No  . Sexual activity: Not on file  Lifestyle  . Physical activity    Days per week: Not on file    Minutes per session: Not on file  . Stress: Not on file  Relationships  . Social Herbalist on phone: Not on file    Gets together: Not on file    Attends  religious service: Not on file    Active member of club or organization: Not on file    Attends meetings of clubs or organizations: Not on file    Relationship status: Not on file  Other Topics Concern  . Not on file  Social History Narrative  . Not on file    Outpatient Encounter Medications as of 12/09/2018  Medication Sig  . allopurinol (ZYLOPRIM) 300 MG tablet TAKE 1 TABLET BY MOUTH  DAILY  . aspirin 81 MG tablet Take 81 mg by mouth daily.  . fluticasone (FLONASE) 50 MCG/ACT nasal spray USE 2 SPRAYS IN BOTH  NOSTRILS AT BEDTIME  . furosemide (LASIX) 20 MG tablet Take 1 tablet (20 mg total) by mouth daily.  Marland Kitchen gabapentin (NEURONTIN) 600 MG tablet TAKE 2 TABLETS BY MOUTH 3  TIMES DAILY  . latanoprost (XALATAN) 0.005 % ophthalmic solution Place 1 drop into both eyes daily.  Marland Kitchen levothyroxine (SYNTHROID, LEVOTHROID) 50 MCG tablet TAKE 1 TABLET BY MOUTH EVERY DAY BEFORE BREAKFAST  . methocarbamol (ROBAXIN) 500 MG tablet TAKE 1 TABLET(500 MG) BY MOUTH EVERY 8 HOURS AS NEEDED FOR MUSCLE SPASMS  . primidone (MYSOLINE) 50 MG tablet TAKE 2 TABLETS BY MOUTH AT  BEDTIME  . tamsulosin (FLOMAX) 0.4 MG CAPS capsule TAKE 1 CAPSULE BY MOUTH  DAILY  . traZODone (DESYREL) 50 MG tablet Take 0.5-1 tablets (25-50 mg total) by mouth at bedtime as needed. for sleep  . vitamin B-12 (CYANOCOBALAMIN) 500 MCG tablet Take 1 tablet (500 mcg total) by mouth daily.  . Vitamin D, Ergocalciferol, (DRISDOL) 50000 units CAPS capsule Take 2x a week  . celecoxib (CELEBREX) 200 MG capsule Take 1 capsule (200 mg total) by mouth 2 (two) times daily. (Patient not taking: Reported on 12/09/2018)  . dorzolamide-timolol (COSOPT) 22.3-6.8 MG/ML ophthalmic solution Place 1 drop into both eyes 2 (two) times daily.  . ferrous sulfate 325 (65 FE) MG tablet Take 1 tablet (325 mg total) by mouth 2 (two) times daily with a meal. (Patient not taking: Reported on 12/09/2018)  . OLANZapine (ZYPREXA) 5 MG tablet   . omeprazole (PRILOSEC) 40 MG  capsule TAKE 1 CAPSULE BY MOUTH  DAILY (Patient not taking: Reported on 12/09/2018)  . propranolol (INDERAL) 10 MG tablet TAKE 1  TABLET BY MOUTH TWICE DAILY (Patient not taking: Reported on 12/09/2018)  . [DISCONTINUED] cephALEXin (KEFLEX) 500 MG capsule Take 1 capsule (500 mg total) by mouth 2 (two) times daily.  . [DISCONTINUED] doxycycline (VIBRAMYCIN) 100 MG capsule Take 1 capsule (100 mg total) by mouth 2 (two) times daily.   No facility-administered encounter medications on file as of 12/09/2018.     Activities of Daily Living In your present state of health, do you have any difficulty performing the following activities: 12/09/2018  Hearing? N  Vision? N  Difficulty concentrating or making decisions? N  Walking or climbing stairs? N  Dressing or bathing? N  Doing errands, shopping? N  Preparing Food and eating ? N  Using the Toilet? N  In the past six months, have you accidently leaked urine? N  Do you have problems with loss of bowel control? N  Managing your Medications? N  Managing your Finances? N  Housekeeping or managing your Housekeeping? Y  Some recent data might be hidden    Patient Care Team: Copland, Gay Filler, MD as PCP - General (Family Medicine)   Assessment:   This is a routine wellness examination for Chans. Physical assessment deferred to PCP.  Exercise Activities and Dietary recommendations Current Exercise Habits: The patient does not participate in regular exercise at present, Exercise limited by: None identified Diet (meal preparation, eat out, water intake, caffeinated beverages, dairy products, fruits and vegetables): well balanced    Goals    . To have less chronic pain.       Fall Risk Fall Risk  12/09/2018 04/06/2017 03/20/2016  Falls in the past year? 1 No No  Number falls in past yr: 1 - -  Injury with Fall? 1 - -  Risk for fall due to : Impaired balance/gait;Impaired mobility - -  Follow up Education provided;Falls prevention discussed - -     Depression Screen PHQ 2/9 Scores 12/09/2018 04/06/2017 03/20/2016  PHQ - 2 Score 2 0 0  PHQ- 9 Score 8 - -    Cognitive Function   MMSE - Mini Mental State Exam 04/06/2017 03/20/2016  Orientation to time 5 5  Orientation to Place 5 5  Registration 3 3  Attention/ Calculation 5 5  Recall 1 2  Language- name 2 objects 2 2  Language- repeat 1 1  Language- follow 3 step command 3 3  Language- read & follow direction 1 1  Write a sentence 1 1  Copy design 1 1  Total score 28 29     6CIT Screen 12/09/2018  What Year? 0 points  What month? 0 points  What time? 0 points  Count back from 20 0 points  Months in reverse 4 points  Repeat phrase 2 points  Total Score 6    Immunization History  Administered Date(s) Administered  . Fluad Quad(high Dose 65+) 11/29/2018  . Influenza, High Dose Seasonal PF 02/02/2018  . Influenza,inj,Quad PF,6+ Mos 11/14/2015  . Influenza-Unspecified 11/18/2016  . Pneumococcal Polysaccharide-23 04/14/2018    Screening Tests Health Maintenance  Topic Date Due  . INFLUENZA VACCINE  10/16/2018  . PNA vac Low Risk Adult (2 of 2 - PCV13) 04/15/2019  . TETANUS/TDAP  03/18/2023  . COLONOSCOPY  03/17/2024  . Hepatitis C Screening  Completed       Plan:    Please schedule your next medicare wellness visit with me in 1 yr.  Continue to eat heart healthy diet (full of fruits, vegetables, whole grains, lean protein, water--limit  salt, fat, and sugar intake) and increase physical activity as tolerated.  Continue doing brain stimulating activities (puzzles, reading, adult coloring books, staying active) to keep memory sharp.     I have personally reviewed and noted the following in the patient's chart:   . Medical and social history . Use of alcohol, tobacco or illicit drugs  . Current medications and supplements . Functional ability and status . Nutritional status . Physical activity . Advanced directives . List of other physicians .  Hospitalizations, surgeries, and ER visits in previous 12 months . Vitals . Screenings to include cognitive, depression, and falls . Referrals and appointments  In addition, I have reviewed and discussed with patient certain preventive protocols, quality metrics, and best practice recommendations. A written personalized care plan for preventive services as well as general preventive health recommendations were provided to patient.     Shela Nevin, RN  12/09/2018  I have reviewed the above MWE note by Ms Vevelyn Royals and agree with her documentation Denny Peon MD

## 2018-12-09 ENCOUNTER — Ambulatory Visit (INDEPENDENT_AMBULATORY_CARE_PROVIDER_SITE_OTHER): Payer: Medicare Other | Admitting: *Deleted

## 2018-12-09 ENCOUNTER — Other Ambulatory Visit: Payer: Self-pay

## 2018-12-09 ENCOUNTER — Encounter: Payer: Self-pay | Admitting: *Deleted

## 2018-12-09 DIAGNOSIS — Z0000 Encounter for general adult medical examination without abnormal findings: Secondary | ICD-10-CM | POA: Diagnosis not present

## 2018-12-09 NOTE — Patient Instructions (Signed)
Please schedule your next medicare wellness visit with me in 1 yr.  Continue to eat heart healthy diet (full of fruits, vegetables, whole grains, lean protein, water--limit salt, fat, and sugar intake) and increase physical activity as tolerated.  Continue doing brain stimulating activities (puzzles, reading, adult coloring books, staying active) to keep memory sharp.    Bryan Hodges , Thank you for taking time to come for your Medicare Wellness Visit. I appreciate your ongoing commitment to your health goals. Please review the following plan we discussed and let me know if I can assist you in the future.   These are the goals we discussed: Goals    . To have less chronic pain.       This is a list of the screening recommended for you and due dates:  Health Maintenance  Topic Date Due  . Flu Shot  10/16/2018  . Pneumonia vaccines (2 of 2 - PCV13) 04/15/2019  . Tetanus Vaccine  03/18/2023  . Colon Cancer Screening  03/17/2024  .  Hepatitis C: One time screening is recommended by Center for Disease Control  (CDC) for  adults born from [birth year] through 1963.   Completed    Health Maintenance After Age 53 After age 53, you are at a higher risk for certain long-term diseases and infections as well as injuries from falls. Falls are a major cause of broken bones and head injuries in people who are older than age 57. Getting regular preventive care can help to keep you healthy and well. Preventive care includes getting regular testing and making lifestyle changes as recommended by your health care provider. Talk with your health care provider about:  Which screenings and tests you should have. A screening is a test that checks for a disease when you have no symptoms.  A diet and exercise plan that is right for you. What should I know about screenings and tests to prevent falls? Screening and testing are the best ways to find a health problem early. Early diagnosis and treatment give you the best  chance of managing medical conditions that are common after age 23. Certain conditions and lifestyle choices may make you more likely to have a fall. Your health care provider may recommend:  Regular vision checks. Poor vision and conditions such as cataracts can make you more likely to have a fall. If you wear glasses, make sure to get your prescription updated if your vision changes.  Medicine review. Work with your health care provider to regularly review all of the medicines you are taking, including over-the-counter medicines. Ask your health care provider about any side effects that may make you more likely to have a fall. Tell your health care provider if any medicines that you take make you feel dizzy or sleepy.  Osteoporosis screening. Osteoporosis is a condition that causes the bones to get weaker. This can make the bones weak and cause them to break more easily.  Blood pressure screening. Blood pressure changes and medicines to control blood pressure can make you feel dizzy.  Strength and balance checks. Your health care provider may recommend certain tests to check your strength and balance while standing, walking, or changing positions.  Foot health exam. Foot pain and numbness, as well as not wearing proper footwear, can make you more likely to have a fall.  Depression screening. You may be more likely to have a fall if you have a fear of falling, feel emotionally low, or feel unable to do activities  that you used to do.  Alcohol use screening. Using too much alcohol can affect your balance and may make you more likely to have a fall. What actions can I take to lower my risk of falls? General instructions  Talk with your health care provider about your risks for falling. Tell your health care provider if: ? You fall. Be sure to tell your health care provider about all falls, even ones that seem minor. ? You feel dizzy, sleepy, or off-balance.  Take over-the-counter and  prescription medicines only as told by your health care provider. These include any supplements.  Eat a healthy diet and maintain a healthy weight. A healthy diet includes low-fat dairy products, low-fat (lean) meats, and fiber from whole grains, beans, and lots of fruits and vegetables. Home safety  Remove any tripping hazards, such as rugs, cords, and clutter.  Install safety equipment such as grab bars in bathrooms and safety rails on stairs.  Keep rooms and walkways well-lit. Activity   Follow a regular exercise program to stay fit. This will help you maintain your balance. Ask your health care provider what types of exercise are appropriate for you.  If you need a cane or walker, use it as recommended by your health care provider.  Wear supportive shoes that have nonskid soles. Lifestyle  Do not drink alcohol if your health care provider tells you not to drink.  If you drink alcohol, limit how much you have: ? 0-1 drink a day for women. ? 0-2 drinks a day for men.  Be aware of how much alcohol is in your drink. In the [place] one drink equals one typical bottle of beer (12 oz), one-half glass of wine (5 oz), or one shot of hard liquor (1 oz).  Do not use any products that contain nicotine or tobacco, such as cigarettes and e-cigarettes. If you need help quitting, ask your health care provider. Summary  Having a healthy lifestyle and getting preventive care can help to protect your health and wellness after age 64.  Screening and testing are the best way to find a health problem early and help you avoid having a fall. Early diagnosis and treatment give you the best chance for managing medical conditions that are more common for people who are older than age 108.  Falls are a major cause of broken bones and head injuries in people who are older than age 10. Take precautions to prevent a fall at home.  Work with your health care provider to learn what changes you can make to  improve your health and wellness and to prevent falls. This information is not intended to replace advice given to you by your health care provider. Make sure you discuss any questions you have with your health care provider. Document Released: 01/14/2017 Document Revised: 06/24/2018 Document Reviewed: 01/14/2017 Elsevier Patient Education  2020 [employer].

## 2018-12-15 ENCOUNTER — Ambulatory Visit (INDEPENDENT_AMBULATORY_CARE_PROVIDER_SITE_OTHER): Payer: Medicare Other | Admitting: Family Medicine

## 2018-12-15 ENCOUNTER — Encounter: Payer: Self-pay | Admitting: Family Medicine

## 2018-12-15 ENCOUNTER — Other Ambulatory Visit: Payer: Self-pay

## 2018-12-15 VITALS — BP 110/70 | HR 67 | Temp 97.5°F | Resp 16 | Ht 70.0 in | Wt 210.0 lb

## 2018-12-15 DIAGNOSIS — E291 Testicular hypofunction: Secondary | ICD-10-CM

## 2018-12-15 NOTE — Progress Notes (Signed)
[facility] [address], [place], [place] [address] [phone]  Date:  12/15/2018   Name:  Bryan Hodges   DOB:  1950-11-09   MRN:  [id number]  PCP:  Darreld Mclean, MD    Chief Complaint: Memory Loss and Testosterone Level   History of Present Illness:  Bryan Hodges is a 68 y.o. very pleasant male patient who presents with the following:  Patient with multiple medical problems, here today for follow-up visit Today he is most concerned about low testosterone and other lab abnormalities Patient is actually moved to [place], and most of his care is now in that local area Our last visit together was in [place] visit  History of hypothyroidism, glaucoma, rheumatoid arthritis, gastric bypass surgery year 2000, mental illness, movement disorder, history of DVT We note significant anemia over the summer, he had a visit with [facility] hematology June 12:  Patient with moderate iron deficiency anemia. This is likely due to his remote gastric bypass surgery. EGD and colonoscopy in 2016 reportedly negative and patient reportedly had a negative stool Hemoccult last year. Patient has briefly taken oral iron. I think he is unlikely to respond to oral iron adequately given his prior gastric bypass. We discussed Injectafer. Patient would like to pursue this. Discussed risks of allergic reactions. Receive first injection next week and return for follow-up visit in 8 weeks time.  1. Anemia due to unknown mechanism     Followed up with hematology in August, was feeling better.  Hemoglobin improved to 11.9 from 8.7 Flu shot completed for the year Other health maintenance is up-to-date, too early for Prevnar 13  He notes that he continues to feel like he has no "get up and go." He will see his hematologist in a couple of weeks- he is not taking oral iron right now, he has been getting iron infusions.  He wonders if he to take oral  iron.  Advised him to wait for his follow-up appointment, and let his hematologist advise him on this matter  He notes that he went to urology, was diagnosed with low testosterone and was supposed to have a testosterone treatment of some type-it sounds like they were going to do a testosterone pellet.   His main concern today is that he is not been able to get through to his urology office to schedule this follow-up.  He states he has tried multiple times and left multiple messages, but has not been able to reach them.  His urologist is:  [facility] in [place] [address] [place], [place] [address] [id number]   Patient Active Problem List   Diagnosis Date Noted  . Gastroesophageal reflux disease without esophagitis 06/17/2016  . Other allergic rhinitis 06/17/2016  . Current use of beta blocker 06/17/2016  . Rheumatoid arthritis involving right shoulder ([place]) 06/17/2016  . Complications of gastric bypass surgery 06/17/2016  . Lower extremity edema 03/26/2016  . Mild diastolic dysfunction 99991111  . Hypothyroidism due to acquired atrophy of thyroid 11/14/2015  . Glaucoma 11/14/2015  . Leg weakness, bilateral 11/14/2015  . History of back injury 11/14/2015  . Bipolar affective disorder in remission ([place]) 11/14/2015    Past Medical History:  Diagnosis Date  . Depression   . Eating disorder   . Elevated blood pressure reading   . Frequent headaches   . Glaucoma   . H/O blood clots    associated  with an injury per patient  . Hay fever   . Kidney stone   . Phlebitis     Past Surgical History:  Procedure Laterality Date  . APPENDECTOMY  2000  . CHOLECYSTECTOMY  2000  . GASTRIC BYPASS  2000  . GASTRIC BYPASS  2003  . OTHER SURGICAL HISTORY  2001   Reconstructive surgery   . SHOULDER SURGERY      Social History   Tobacco Use  . Smoking status: Former Smoker    Types: Cigarettes    Quit date: 03/17/1976    Years since  quitting: 42.7  . Smokeless tobacco: Former Systems developer    Types: Chew  Substance Use Topics  . Alcohol use: Yes    Comment: occasional beer  . Drug use: No    Family History  Problem Relation Age of Onset  . Arthritis Mother   . Hyperlipidemia Mother   . Heart disease Mother   . Hypertension Mother   . Alcohol abuse Mother   . Heart attack Mother   . Hyperlipidemia Father   . Heart disease Father   . Hypertension Father   . Alcohol abuse Sister   . Other Sister        Brain tumor  . Alcohol abuse Brother   . Other Brother        Brain tumor  . Ovarian cancer Paternal Aunt   . Diabetes Paternal Aunt   . Stroke Paternal Grandmother   . Asthma Neg Hx   . Eczema Neg Hx   . Urticaria Neg Hx   . Immunodeficiency Neg Hx   . Angioedema Neg Hx   . Allergic rhinitis Neg Hx     No Known Allergies  Medication list has been reviewed and updated.  Current Outpatient Medications on File Prior to Visit  Medication Sig Dispense Refill  . allopurinol (ZYLOPRIM) 300 MG tablet TAKE 1 TABLET BY MOUTH  DAILY 90 tablet 1  . aspirin 81 MG tablet Take 81 mg by mouth daily.    . dorzolamide-timolol (COSOPT) 22.3-6.8 MG/ML ophthalmic solution Place 1 drop into both eyes 2 (two) times daily.    . fluticasone (FLONASE) 50 MCG/ACT nasal spray USE 2 SPRAYS IN BOTH  NOSTRILS AT BEDTIME 48 g 2  . furosemide (LASIX) 20 MG tablet Take 1 tablet (20 mg total) by mouth daily. 90 tablet 3  . gabapentin (NEURONTIN) 600 MG tablet TAKE 2 TABLETS BY MOUTH 3  TIMES DAILY 540 tablet 1  . latanoprost (XALATAN) 0.005 % ophthalmic solution Place 1 drop into both eyes daily.    Marland Kitchen levothyroxine (SYNTHROID, LEVOTHROID) 50 MCG tablet TAKE 1 TABLET BY MOUTH EVERY DAY BEFORE BREAKFAST 30 tablet 5  . methocarbamol (ROBAXIN) 500 MG tablet TAKE 1 TABLET(500 MG) BY MOUTH EVERY 8 HOURS AS NEEDED FOR MUSCLE SPASMS 90 tablet 0  . OLANZapine (ZYPREXA) 5 MG tablet     . omeprazole (PRILOSEC) 40 MG capsule TAKE 1 CAPSULE BY MOUTH   DAILY 90 capsule 1  . primidone (MYSOLINE) 50 MG tablet TAKE 2 TABLETS BY MOUTH AT  BEDTIME 180 tablet 1  . propranolol (INDERAL) 10 MG tablet TAKE 1 TABLET BY MOUTH TWICE DAILY 180 tablet 0  . tamsulosin (FLOMAX) 0.4 MG CAPS capsule TAKE 1 CAPSULE BY MOUTH  DAILY 90 capsule 3  . traZODone (DESYREL) 50 MG tablet Take 0.5-1 tablets (25-50 mg total) by mouth at bedtime as needed. for sleep 90 tablet 2  . vitamin B-12 (CYANOCOBALAMIN) 500 MCG tablet Take  1 tablet (500 mcg total) by mouth daily. 30 tablet 3  . Vitamin D, Ergocalciferol, (DRISDOL) 50000 units CAPS capsule Take 2x a week 30 capsule 3   No current facility-administered medications on file prior to visit.     Review of Systems:  As per HPI- otherwise negative.  No fever or chills Physical Examination: Vitals:   12/15/18 1050  BP: 110/70  Pulse: 67  Resp: 16  Temp: (!) 97.5 F (36.4 C)  SpO2: 97%   Vitals:   12/15/18 1050  Weight: 210 lb (95.3 kg)  Height: 5\' 10"  (1.778 m)   Body mass index is 30.13 kg/m. Ideal Body Weight: Weight in (lb) to have BMI = 25: 173.9  GEN: WDWN, NAD, Non-toxic, A & O x 3, appears his normal self HEENT: Atraumatic, Normocephalic. Neck supple. No masses, No LAD. Ears and Nose: No external deformity. CV: RRR, No M/G/R. No JVD. No thrill. No extra heart sounds. PULM: CTA B, no wheezes, crackles, rhonchi. No retractions. No resp. distress. No accessory muscle use. EXTR: No c/c/e NEURO Normal gait for patient, uses cane PSYCH: Normally interactive. Conversant. Not depressed or anxious appearing.  Calm demeanor.    Assessment and Plan: Hypogonadism in male  Here today for a follow-up visit.  It seems that Mars's main concern is that he is not been able to get through to his urology office to discuss follow-up.  We called them and were able to get through and arranged an appointment for him today.  He is pleased, states he has no further needs today  Signed Lamar Blinks, MD

## 2018-12-17 DIAGNOSIS — D508 Other iron deficiency anemias: Secondary | ICD-10-CM | POA: Diagnosis not present

## 2018-12-17 DIAGNOSIS — D61818 Other pancytopenia: Secondary | ICD-10-CM | POA: Diagnosis not present

## 2018-12-28 DIAGNOSIS — D61818 Other pancytopenia: Secondary | ICD-10-CM | POA: Diagnosis not present

## 2019-01-03 ENCOUNTER — Telehealth: Payer: Self-pay | Admitting: Family Medicine

## 2019-01-03 DIAGNOSIS — M5442 Lumbago with sciatica, left side: Secondary | ICD-10-CM

## 2019-01-03 DIAGNOSIS — M25512 Pain in left shoulder: Secondary | ICD-10-CM

## 2019-01-03 DIAGNOSIS — G8929 Other chronic pain: Secondary | ICD-10-CM

## 2019-01-03 MED ORDER — METHOCARBAMOL 500 MG PO TABS: ORAL_TABLET | ORAL | 0 refills | Status: DC

## 2019-01-03 NOTE — Telephone Encounter (Signed)
Medication Refill - Medication: methocarbamol (ROBAXIN) 500 MG tablet [id number]    Has the patient contacted their pharmacy? Yes.   (Agent: If no, request that the patient contact the pharmacy for the refill.) (Agent: If yes, when and what did the pharmacy advise?)  Preferred Pharmacy (with phone number or street name): [facility] in [place] [employer]: Please be advised that RX refills may take up to 3 business days. We ask that you follow-up with your pharmacy.

## 2019-01-03 NOTE — Telephone Encounter (Signed)
Med refilled.

## 2019-01-23 ENCOUNTER — Other Ambulatory Visit: Payer: Self-pay | Admitting: Family Medicine

## 2019-01-23 DIAGNOSIS — F5101 Primary insomnia: Secondary | ICD-10-CM

## 2019-01-23 DIAGNOSIS — R200 Anesthesia of skin: Secondary | ICD-10-CM

## 2019-01-23 DIAGNOSIS — G250 Essential tremor: Secondary | ICD-10-CM

## 2019-02-14 DIAGNOSIS — D508 Other iron deficiency anemias: Secondary | ICD-10-CM | POA: Diagnosis not present

## 2019-02-14 DIAGNOSIS — D61818 Other pancytopenia: Secondary | ICD-10-CM | POA: Diagnosis not present

## 2019-02-18 ENCOUNTER — Telehealth: Payer: Self-pay | Admitting: Family Medicine

## 2019-02-18 DIAGNOSIS — M5442 Lumbago with sciatica, left side: Secondary | ICD-10-CM

## 2019-02-18 DIAGNOSIS — G8929 Other chronic pain: Secondary | ICD-10-CM

## 2019-02-18 DIAGNOSIS — M25512 Pain in left shoulder: Secondary | ICD-10-CM

## 2019-02-18 MED ORDER — METHOCARBAMOL 500 MG PO TABS: ORAL_TABLET | ORAL | 0 refills | Status: DC

## 2019-02-18 NOTE — Telephone Encounter (Signed)
Patient requesting methocarbamol (ROBAXIN) 500 MG tablet due to muscle spasm and states he's completely out, please send to   [facility] [place] [place] [address]

## 2019-02-18 NOTE — Telephone Encounter (Signed)
Medication refilled

## 2019-02-21 ENCOUNTER — Telehealth: Payer: Self-pay

## 2019-02-21 NOTE — Telephone Encounter (Signed)
PA initiated via Covermymeds; [id number]. Awaiting determination.

## 2019-02-21 NOTE — Telephone Encounter (Signed)
PA approved through 03/16/2020. Reference number: [id number].

## 2019-03-17 ENCOUNTER — Ambulatory Visit: Payer: Self-pay

## 2019-03-17 NOTE — Telephone Encounter (Signed)
  Pt. Reports he has had abdominal pain with "pretty bad reflux for 2-3 months, but it is getting worse." Pain is mainly after eating and has reflux after going to bed. Prilosec not helping. Using TUMS. Instructed to prop up the head of his bed. Request an appointment for next week. Scheduled. Instructed of symptoms worsen, to call back. Verbalizes understanding. Reason for Disposition . Abdominal pain is a chronic symptom (recurrent or ongoing AND present > 4 weeks)  Answer Assessment - Initial Assessment Questions 1. LOCATION: "Where does it hurt?"      Left side under ribs 2. RADIATION: "Does the pain shoot anywhere else?" (e.g., chest, back)     No 3. ONSET: "When did the pain begin?" (Minutes, hours or days ago)      Started 2-3 months ago - getting worse 4. SUDDEN: "Gradual or sudden onset?"     Gradual 5. PATTERN "Does the pain come and go, or is it constant?"    - If constant: "Is it getting better, staying the same, or worsening?"      (Note: Constant means the pain never goes away completely; most serious pain is constant and it progresses)     - If intermittent: "How long does it last?" "Do you have pain now?"     (Note: Intermittent means the pain goes away completely between bouts)     Comes and goes 6. SEVERITY: "How bad is the pain?"  (e.g., Scale 1-10; mild, moderate, or severe)    - MILD (1-3): doesn't interfere with normal activities, abdomen soft and not tender to touch     - MODERATE (4-7): interferes with normal activities or awakens from sleep, tender to touch     - SEVERE (8-10): excruciating pain, doubled over, unable to do any normal activities       5-6 7. RECURRENT SYMPTOM: "Have you ever had this type of abdominal pain before?" If so, ask: "When was the last time?" and "What happened that time?"      Mainly after eating 8. CAUSE: "What do you think is causing the abdominal pain?"     Unsure 9. RELIEVING/AGGRAVATING FACTORS: "What makes it better or worse?"  (e.g., movement, antacids, bowel movement)     No 10. OTHER SYMPTOMS: "Has there been any vomiting, diarrhea, constipation, or urine problems?"       Reflux  Protocols used: ABDOMINAL PAIN - MALE-A-AH

## 2019-03-21 NOTE — Progress Notes (Deleted)
[place] at [facility] [address], [place], [place] [address] [id number] [phone]  Date:  03/23/2019   Name:  Bryan Hodges   DOB:  [birth year]   MRN:  [id number]  PCP:  Darreld Mclean, MD    Chief Complaint: No chief complaint on file.   History of Present Illness:  Bryan Hodges is a 69 y.o. very pleasant male patient who presents with the following:  Medically complicated patient with history of hypothyroidism, glaucoma, RA, gastric bypass surgery year 2000, mental illness, movement disorder, history of DVT, anemia Virtual visit today due to pandemic Patient location is home, provider location is office.  Patient identity is confirmed with 2 factors, he gives consent for virtual visit today  Last seen by myself in September 2020-at that time he was seen neurology for treatment of low testosterone  Today he is concerned about reflux and abdominal pain  Patient Active Problem List   Diagnosis Date Noted  . Gastroesophageal reflux disease without esophagitis 06/17/2016  . Other allergic rhinitis 06/17/2016  . Current use of beta blocker 06/17/2016  . Rheumatoid arthritis involving right shoulder ([place]) 06/17/2016  . Complications of gastric bypass surgery 06/17/2016  . Lower extremity edema 03/26/2016  . Mild diastolic dysfunction 99991111  . Hypothyroidism due to acquired atrophy of thyroid 11/14/2015  . Glaucoma 11/14/2015  . Leg weakness, bilateral 11/14/2015  . History of back injury 11/14/2015  . Bipolar affective disorder in remission ([place]) 11/14/2015    Past Medical History:  Diagnosis Date  . Depression   . Eating disorder   . Elevated blood pressure reading   . Frequent headaches   . Glaucoma   . H/O blood clots    associated with an injury per patient  . Hay fever   . Kidney stone   . Phlebitis     Past Surgical History:  Procedure Laterality Date  . APPENDECTOMY  2000  . CHOLECYSTECTOMY  2000  . GASTRIC  BYPASS  2000  . GASTRIC BYPASS  2003  . OTHER SURGICAL HISTORY  2001   Reconstructive surgery   . SHOULDER SURGERY      Social History   Tobacco Use  . Smoking status: Former Smoker    Types: Cigarettes    Quit date: 03/17/1976    Years since quitting: 43.0  . Smokeless tobacco: Former Systems developer    Types: Chew  Substance Use Topics  . Alcohol use: Yes    Comment: occasional beer  . Drug use: No    Family History  Problem Relation Age of Onset  . Arthritis Mother   . Hyperlipidemia Mother   . Heart disease Mother   . Hypertension Mother   . Alcohol abuse Mother   . Heart attack Mother   . Hyperlipidemia Father   . Heart disease Father   . Hypertension Father   . Alcohol abuse Sister   . Other Sister        Brain tumor  . Alcohol abuse Brother   . Other Brother        Brain tumor  . Ovarian cancer Paternal Aunt   . Diabetes Paternal Aunt   . Stroke Paternal Grandmother   . Asthma Neg Hx   . Eczema Neg Hx   . Urticaria Neg Hx   . Immunodeficiency Neg Hx   . Angioedema Neg Hx   . Allergic rhinitis Neg Hx     No Known Allergies  Medication list has  been reviewed and updated.  Current Outpatient Medications on File Prior to Visit  Medication Sig Dispense Refill  . allopurinol (ZYLOPRIM) 300 MG tablet TAKE 1 TABLET BY MOUTH  DAILY 90 tablet 3  . aspirin 81 MG tablet Take 81 mg by mouth daily.    . dorzolamide-timolol (COSOPT) 22.3-6.8 MG/ML ophthalmic solution Place 1 drop into both eyes 2 (two) times daily.    . fluticasone (FLONASE) 50 MCG/ACT nasal spray USE 2 SPRAYS IN BOTH  NOSTRILS AT BEDTIME 48 g 2  . furosemide (LASIX) 20 MG tablet Take 1 tablet (20 mg total) by mouth daily. 90 tablet 3  . gabapentin (NEURONTIN) 600 MG tablet TAKE 2 TABLETS BY MOUTH 3  TIMES DAILY 540 tablet 3  . latanoprost (XALATAN) 0.005 % ophthalmic solution Place 1 drop into both eyes daily.    Marland Kitchen levothyroxine (SYNTHROID, LEVOTHROID) 50 MCG tablet TAKE 1 TABLET BY MOUTH EVERY DAY BEFORE  BREAKFAST 30 tablet 5  . methocarbamol (ROBAXIN) 500 MG tablet TAKE 1 TABLET(500 MG) BY MOUTH EVERY 8 HOURS AS NEEDED FOR MUSCLE SPASMS 90 tablet 0  . OLANZapine (ZYPREXA) 5 MG tablet     . omeprazole (PRILOSEC) 40 MG capsule TAKE 1 CAPSULE BY MOUTH  DAILY 90 capsule 3  . primidone (MYSOLINE) 50 MG tablet TAKE 2 TABLETS BY MOUTH AT  BEDTIME 180 tablet 3  . propranolol (INDERAL) 10 MG tablet TAKE 1 TABLET BY MOUTH TWICE DAILY 180 tablet 0  . tamsulosin (FLOMAX) 0.4 MG CAPS capsule TAKE 1 CAPSULE BY MOUTH  DAILY 90 capsule 3  . traZODone (DESYREL) 50 MG tablet TAKE 1/2 TO 1 TABLET BY  MOUTH AT BEDTIME AS NEEDED  FOR SLEEP 90 tablet 3  . vitamin B-12 (CYANOCOBALAMIN) 500 MCG tablet Take 1 tablet (500 mcg total) by mouth daily. 30 tablet 3  . Vitamin D, Ergocalciferol, (DRISDOL) 50000 units CAPS capsule Take 2x a week 30 capsule 3   No current facility-administered medications on file prior to visit.    Review of Systems:  As per HPI- otherwise negative.   Physical Examination: There were no vitals filed for this visit. There were no vitals filed for this visit. There is no height or weight on file to calculate BMI. Ideal Body Weight:      Assessment and Plan: ***  Signed Lamar Blinks, MD

## 2019-03-23 ENCOUNTER — Other Ambulatory Visit: Payer: Self-pay

## 2019-03-23 ENCOUNTER — Ambulatory Visit: Payer: Medicare Other | Admitting: Family Medicine

## 2019-03-26 NOTE — Progress Notes (Signed)
[place] at [facility] [address], [place], [place] [address] [id number] [phone]  Date:  03/28/2019   Name:  Bryan Hodges   DOB:  11-Dec-1950   MRN:  [id number]  PCP:  Darreld Mclean, MD    Chief Complaint: No chief complaint on file.   History of Present Illness:  Bryan Hodges is a 69 y.o. very pleasant male patient who presents with the following:  Patient with complex past medical history Married to Bryan Hodges who is also present on the call today  He has moved to near [place] and has been in the process of finding doctors there Last seen by myself in September The patient, myself and his wife Bryan Hodges are present on the call today Patient location is home, my location is office Patient any confirmed with 2 factors, he gives consent for virtual visit today History of mood disorder, gastric bypass, rheumatoid arthritis, hypothyroidism, leg weakness/movement disorder Virtual visit today for concern of abdominal pain and reflux for about 2 months  Demar notes he is getting reflux/ food regurgitation at night esp, and he gets GERD after meals He is using antacids - Tums, using at meals and at night  He is not using omeprazole right now- he is not sure when he was last taking this med  No vomiting but he might regurg some food  No diarrhea- no bowel change, no blood in stools He has epigastric pain with eating for "months and months and months, maybe even a couple of years" but getting worse  Eating any sort of food seems to make him worse  He is avoiding spicy foods but any foods exacerbate his sx  He does not have a local GI right now   He denies any chest pain or shortness of breath   Patient Active Problem List   Diagnosis Date Noted  . Gastroesophageal reflux disease without esophagitis 06/17/2016  . Other allergic rhinitis 06/17/2016  . Current use of beta blocker 06/17/2016  . Rheumatoid arthritis involving right shoulder  ([place]) 06/17/2016  . Complications of gastric bypass surgery 06/17/2016  . Lower extremity edema 03/26/2016  . Mild diastolic dysfunction 99991111  . Hypothyroidism due to acquired atrophy of thyroid 11/14/2015  . Glaucoma 11/14/2015  . Leg weakness, bilateral 11/14/2015  . History of back injury 11/14/2015  . Bipolar affective disorder in remission ([place]) 11/14/2015    Past Medical History:  Diagnosis Date  . Depression   . Eating disorder   . Elevated blood pressure reading   . Frequent headaches   . Glaucoma   . H/O blood clots    associated with an injury per patient  . Hay fever   . Kidney stone   . Phlebitis     Past Surgical History:  Procedure Laterality Date  . APPENDECTOMY  2000  . CHOLECYSTECTOMY  2000  . GASTRIC BYPASS  2000  . GASTRIC BYPASS  2003  . OTHER SURGICAL HISTORY  2001   Reconstructive surgery   . SHOULDER SURGERY      Social History   Tobacco Use  . Smoking status: Former Smoker    Types: Cigarettes    Quit date: 03/17/1976    Years since quitting: 43.0  . Smokeless tobacco: Former Systems developer    Types: Chew  Substance Use Topics  . Alcohol use: Yes    Comment: occasional beer  . Drug use: No    Family History  Problem Relation Age  of Onset  . Arthritis Mother   . Hyperlipidemia Mother   . Heart disease Mother   . Hypertension Mother   . Alcohol abuse Mother   . Heart attack Mother   . Hyperlipidemia Father   . Heart disease Father   . Hypertension Father   . Alcohol abuse Sister   . Other Sister        Brain tumor  . Alcohol abuse Brother   . Other Brother        Brain tumor  . Ovarian cancer Paternal Aunt   . Diabetes Paternal Aunt   . Stroke Paternal Grandmother   . Asthma Neg Hx   . Eczema Neg Hx   . Urticaria Neg Hx   . Immunodeficiency Neg Hx   . Angioedema Neg Hx   . Allergic rhinitis Neg Hx     No Known Allergies  Medication list has been reviewed and updated.  Current Outpatient Medications on File Prior to  Visit  Medication Sig Dispense Refill  . allopurinol (ZYLOPRIM) 300 MG tablet TAKE 1 TABLET BY MOUTH  DAILY 90 tablet 3  . aspirin 81 MG tablet Take 81 mg by mouth daily.    . dorzolamide-timolol (COSOPT) 22.3-6.8 MG/ML ophthalmic solution Place 1 drop into both eyes 2 (two) times daily.    . fluticasone (FLONASE) 50 MCG/ACT nasal spray USE 2 SPRAYS IN BOTH  NOSTRILS AT BEDTIME 48 g 2  . furosemide (LASIX) 20 MG tablet Take 1 tablet (20 mg total) by mouth daily. 90 tablet 3  . gabapentin (NEURONTIN) 600 MG tablet TAKE 2 TABLETS BY MOUTH 3  TIMES DAILY 540 tablet 3  . latanoprost (XALATAN) 0.005 % ophthalmic solution Place 1 drop into both eyes daily.    Marland Kitchen levothyroxine (SYNTHROID, LEVOTHROID) 50 MCG tablet TAKE 1 TABLET BY MOUTH EVERY DAY BEFORE BREAKFAST 30 tablet 5  . methocarbamol (ROBAXIN) 500 MG tablet TAKE 1 TABLET(500 MG) BY MOUTH EVERY 8 HOURS AS NEEDED FOR MUSCLE SPASMS 90 tablet 0  . OLANZapine (ZYPREXA) 5 MG tablet     . omeprazole (PRILOSEC) 40 MG capsule TAKE 1 CAPSULE BY MOUTH  DAILY 90 capsule 3  . primidone (MYSOLINE) 50 MG tablet TAKE 2 TABLETS BY MOUTH AT  BEDTIME 180 tablet 3  . propranolol (INDERAL) 10 MG tablet TAKE 1 TABLET BY MOUTH TWICE DAILY 180 tablet 0  . tamsulosin (FLOMAX) 0.4 MG CAPS capsule TAKE 1 CAPSULE BY MOUTH  DAILY 90 capsule 3  . traZODone (DESYREL) 50 MG tablet TAKE 1/2 TO 1 TABLET BY  MOUTH AT BEDTIME AS NEEDED  FOR SLEEP 90 tablet 3  . vitamin B-12 (CYANOCOBALAMIN) 500 MCG tablet Take 1 tablet (500 mcg total) by mouth daily. 30 tablet 3  . Vitamin D, Ergocalciferol, (DRISDOL) 50000 units CAPS capsule Take 2x a week 30 capsule 3   No current facility-administered medications on file prior to visit.    Review of Systems:  As per HPI- otherwise negative. No fever or chills  Physical Examination: There were no vitals filed for this visit. There were no vitals filed for this visit. There is no height or weight on file to calculate BMI. Ideal Body  Weight:    Connected via video monitor, but sound quality was bad.  Convert to telephone Patient looks well, his normal self No cough, wheezing, distress is noted Patient check vitals at home Temperature 96.7 113/85 Pulse 75  Assessment and Plan: Epigastric pain - Plan: omeprazole (PRILOSEC) 40 MG capsule, sucralfate (CARAFATE)  1 g tablet, Ambulatory referral to Gastroenterology  Virtual visit today to discuss epigastric pain and reflux.  Patient with history of gastric bypass, RA, hypothyroidism, movement disorder He is not currently taking a PPI  Start back on omeprazole 40 mg, also add Carafate 1 g 4 times daily for 10 days Discussed conservative management including eating smaller meals, avoiding spicy or heavy foods, avoiding eating shortly before bedtime, elevating the head of bed. I suspect he may need an upper GI scope.  Looked up gastroenterology offices near his home, made a referral to [facility] gastroenterology.  Patient is aware and given contact information, he plans to call them in a couple of days if they have not called him already He will let me know if I can doing them to help in the meantime  Moderate medical decision making   Signed Lamar Blinks, MD

## 2019-03-28 ENCOUNTER — Other Ambulatory Visit: Payer: Self-pay

## 2019-03-28 ENCOUNTER — Ambulatory Visit (INDEPENDENT_AMBULATORY_CARE_PROVIDER_SITE_OTHER): Payer: Medicare Other | Admitting: Family Medicine

## 2019-03-28 ENCOUNTER — Encounter: Payer: Self-pay | Admitting: Family Medicine

## 2019-03-28 DIAGNOSIS — R1013 Epigastric pain: Secondary | ICD-10-CM | POA: Diagnosis not present

## 2019-03-28 MED ORDER — SUCRALFATE 1 G PO TABS: 1.0000 g | ORAL_TABLET | Freq: Three times a day (TID) | ORAL | 0 refills | Status: DC

## 2019-03-28 MED ORDER — OMEPRAZOLE 40 MG PO CPDR: 40.0000 mg | DELAYED_RELEASE_CAPSULE | Freq: Every day | ORAL | 3 refills | Status: DC

## 2019-04-05 ENCOUNTER — Telehealth: Payer: Self-pay | Admitting: Family Medicine

## 2019-04-05 DIAGNOSIS — G8929 Other chronic pain: Secondary | ICD-10-CM

## 2019-04-05 DIAGNOSIS — E611 Iron deficiency: Secondary | ICD-10-CM

## 2019-04-05 DIAGNOSIS — M5442 Lumbago with sciatica, left side: Secondary | ICD-10-CM

## 2019-04-05 DIAGNOSIS — R1013 Epigastric pain: Secondary | ICD-10-CM

## 2019-04-05 NOTE — Telephone Encounter (Signed)
ferrous sulfate 325 (65 FE) MG tablet Taking 1 pill 2x a day Pharmacy told pt they have requested RX and have not received a response from the office. This RX is in the discontinued but pt states he has been taking daily. Pt was previously on IV iron but stopped that a while ago.  methocarbamol (ROBAXIN) 500 MG tablet  Taking 1 pill 3x a day Pt requesting refill - no refills at the pharmacy and was told it needs authorization.  sucralfate (CARAFATE) 1 g tablet  Taking 1 pill 4x a day Pt states pharmacy told him authorization is needed. He will take the last dose tomorrow morning 1/20. He said the pharmacy advised they have been faxing request for PA since 03/28/2019.   [facility] [place], [place] AT [place] Phone:  [phone]  Fax:  [phone]

## 2019-04-07 NOTE — Telephone Encounter (Signed)
Ok to refill these medications? Ferrous sulfate not on current med list

## 2019-04-08 MED ORDER — FERROUS SULFATE 325 (65 FE) MG PO TBEC: 325.0000 mg | DELAYED_RELEASE_TABLET | Freq: Two times a day (BID) | ORAL | 3 refills | Status: DC

## 2019-04-08 MED ORDER — METHOCARBAMOL 500 MG PO TABS: ORAL_TABLET | ORAL | 2 refills | Status: DC

## 2019-04-08 NOTE — Telephone Encounter (Signed)
Hi Bryan Hodges I refilled the iron and methocarbamol.   Could you please give him a call- typically the carafate is not a long term drug, he does not need to keep taking it  Please be cautious with methocarbamol due to sedation Also, he needs to have an iron level and CBC checked soon to see how he is responding to the iron supplement.  Thank you

## 2019-04-08 NOTE — Telephone Encounter (Signed)
Patient notified and verbalized understanding. 

## 2019-04-12 ENCOUNTER — Telehealth: Payer: Self-pay | Admitting: Family Medicine

## 2019-04-12 NOTE — Telephone Encounter (Signed)
Do you want to give him another round of sucralfate?

## 2019-04-12 NOTE — Telephone Encounter (Signed)
Medication Refill - Medication:sucralfate (CARAFATE) 1 g tablet [id number]      Preferred Pharmacy (with phone number or street name):  [facility] [id number] - [place], [address] AT [address]  [place]  [address]  Phone: [phone] Fax: [phone]     Agent: Please be advised that RX refills may take up to 3 business days. We ask that you follow-up with your pharmacy.

## 2019-04-13 NOTE — Telephone Encounter (Signed)
Pt does not need more carafate- he is aware.

## 2019-04-15 ENCOUNTER — Telehealth: Payer: Self-pay

## 2019-04-15 DIAGNOSIS — R1013 Epigastric pain: Secondary | ICD-10-CM

## 2019-04-15 MED ORDER — SUCRALFATE 1 G PO TABS: 1.0000 g | ORAL_TABLET | Freq: Three times a day (TID) | ORAL | 0 refills | Status: AC

## 2019-04-15 NOTE — Telephone Encounter (Signed)
Patient called in to request a prescription refill for (SUCRALFATE) 1mg  please follow up with the patient at [phone] thanks.

## 2019-04-15 NOTE — Telephone Encounter (Signed)
Called patient back, he states he knows you told him he is not suppose to take this long term but he is having left lower quadrant pain when he eats. He states he sees GI in less than a week but he is asking for a refill to be sent just until he can be seen by GI. He states he is taking an ant-acid three times a day and it is still not helping. The sulcrafate is the only thing that makes him feel better.

## 2019-04-28 DIAGNOSIS — K219 Gastro-esophageal reflux disease without esophagitis: Secondary | ICD-10-CM | POA: Diagnosis not present

## 2019-04-28 DIAGNOSIS — R1010 Upper abdominal pain, unspecified: Secondary | ICD-10-CM | POA: Diagnosis not present

## 2019-05-19 ENCOUNTER — Other Ambulatory Visit: Payer: Self-pay | Admitting: Family Medicine

## 2019-05-31 DIAGNOSIS — H401133 Primary open-angle glaucoma, bilateral, severe stage: Secondary | ICD-10-CM | POA: Diagnosis not present

## 2019-05-31 DIAGNOSIS — H26492 Other secondary cataract, left eye: Secondary | ICD-10-CM | POA: Diagnosis not present

## 2019-06-01 ENCOUNTER — Telehealth: Payer: Self-pay | Admitting: Family Medicine

## 2019-06-01 NOTE — Telephone Encounter (Signed)
Placed in providers folder for review.

## 2019-06-01 NOTE — Telephone Encounter (Signed)
Pt dropped off document to be filled out by provider (1 page Application for Handicapped Drivers Registration Plate) Pt would like document to be mailed out to home address when ready: [address] [place], [place] [address]. Document put at front office tray under providers name.

## 2019-06-01 NOTE — Telephone Encounter (Signed)
Given back, can mail now

## 2019-06-02 DIAGNOSIS — Z01818 Encounter for other preprocedural examination: Secondary | ICD-10-CM | POA: Diagnosis not present

## 2019-06-02 NOTE — Telephone Encounter (Signed)
Form has been mailed

## 2019-06-07 DIAGNOSIS — K3189 Other diseases of stomach and duodenum: Secondary | ICD-10-CM | POA: Diagnosis not present

## 2019-06-07 DIAGNOSIS — R1310 Dysphagia, unspecified: Secondary | ICD-10-CM | POA: Diagnosis not present

## 2019-06-07 DIAGNOSIS — R1011 Right upper quadrant pain: Secondary | ICD-10-CM | POA: Diagnosis not present

## 2019-06-07 DIAGNOSIS — K222 Esophageal obstruction: Secondary | ICD-10-CM | POA: Diagnosis not present

## 2019-06-07 DIAGNOSIS — Z79899 Other long term (current) drug therapy: Secondary | ICD-10-CM | POA: Diagnosis not present

## 2019-06-07 DIAGNOSIS — D509 Iron deficiency anemia, unspecified: Secondary | ICD-10-CM | POA: Diagnosis not present

## 2019-06-07 DIAGNOSIS — R1010 Upper abdominal pain, unspecified: Secondary | ICD-10-CM | POA: Diagnosis not present

## 2019-06-07 DIAGNOSIS — Z7982 Long term (current) use of aspirin: Secondary | ICD-10-CM | POA: Diagnosis not present

## 2019-06-07 DIAGNOSIS — K2930 Chronic superficial gastritis without bleeding: Secondary | ICD-10-CM | POA: Diagnosis not present

## 2019-06-07 DIAGNOSIS — K319 Disease of stomach and duodenum, unspecified: Secondary | ICD-10-CM | POA: Diagnosis not present

## 2019-06-07 DIAGNOSIS — Z9884 Bariatric surgery status: Secondary | ICD-10-CM | POA: Diagnosis not present

## 2019-06-07 DIAGNOSIS — K219 Gastro-esophageal reflux disease without esophagitis: Secondary | ICD-10-CM | POA: Diagnosis not present

## 2019-07-25 DIAGNOSIS — H1131 Conjunctival hemorrhage, right eye: Secondary | ICD-10-CM | POA: Diagnosis not present

## 2019-07-25 DIAGNOSIS — H26492 Other secondary cataract, left eye: Secondary | ICD-10-CM | POA: Diagnosis not present

## 2019-07-25 DIAGNOSIS — H401133 Primary open-angle glaucoma, bilateral, severe stage: Secondary | ICD-10-CM | POA: Diagnosis not present

## 2019-07-26 DIAGNOSIS — D508 Other iron deficiency anemias: Secondary | ICD-10-CM | POA: Diagnosis not present

## 2019-08-03 ENCOUNTER — Telehealth: Payer: Self-pay

## 2019-08-03 NOTE — Telephone Encounter (Signed)
Patient called in to speak with the nurse or Dr. Lorelei Pont about discuss some different options about his possible tumor. Please call the patient at [phone] as soon as possible.

## 2019-08-04 NOTE — Telephone Encounter (Signed)
Called patient back, he states he does not have a tumor-it is a termor he would like to discuss. He needs to discuss options to help with this. Asked if he had neurologist-said no. Scheduled an appointment to discuss options and possible referral to neurology, with Dr. Lorelei Pont on Monday.

## 2019-08-04 NOTE — Telephone Encounter (Signed)
Left message to return call 

## 2019-08-06 NOTE — Progress Notes (Signed)
[place] at [facility] [address], [place], [place] [address] [id number] [phone]  Date:  08/08/2019   Name:  Bryan Hodges   DOB:  Jul 10, 1950   MRN:  [id number]  PCP:  Darreld Mclean, MD    Chief Complaint: No chief complaint on file.   History of Present Illness:  Bryan Hodges is a 69 y.o. very pleasant male patient who presents with the following:  Virtual visit today to discuss concern of tremor-he has moved near [facility] and is still establishing care closer to home We last had a virtual visit in [place] that time he was having issues with GERD, I started PPI and added Carafate  The patient, myself and his wife [name] are present on the call today Patient location is home, my location is office Patient any confirmed with 2 factors, he gives consent for virtual visit today History of mood disorder, gastric bypass, rheumatoid arthritis, hypothyroidism, leg weakness/movement disorder  Call today regarding a possible procedure he might do for his tremor- he is considering having what sounds like an RFA procedure. We discussed this together- my knowledge about this procedure is limited.    However, per my understands is typically a benign procedure which should not make him worse at the very least  Patient Active Problem List   Diagnosis Date Noted  . Gastroesophageal reflux disease without esophagitis 06/17/2016  . Other allergic rhinitis 06/17/2016  . Current use of beta blocker 06/17/2016  . Rheumatoid arthritis involving right shoulder ([place]) 06/17/2016  . Complications of gastric bypass surgery 06/17/2016  . Lower extremity edema 03/26/2016  . Mild diastolic dysfunction 99991111  . Hypothyroidism due to acquired atrophy of thyroid 11/14/2015  . Glaucoma 11/14/2015  . Leg weakness, bilateral 11/14/2015  . History of back injury 11/14/2015  . Bipolar affective disorder in remission ([place]) 11/14/2015    Past  Medical History:  Diagnosis Date  . Depression   . Eating disorder   . Elevated blood pressure reading   . Frequent headaches   . Glaucoma   . H/O blood clots    associated with an injury per patient  . Hay fever   . Kidney stone   . Phlebitis     Past Surgical History:  Procedure Laterality Date  . APPENDECTOMY  2000  . CHOLECYSTECTOMY  2000  . GASTRIC BYPASS  2000  . GASTRIC BYPASS  2003  . OTHER SURGICAL HISTORY  2001   Reconstructive surgery   . SHOULDER SURGERY      Social History   Tobacco Use  . Smoking status: Former Smoker    Types: Cigarettes    Quit date: 03/17/1976    Years since quitting: 43.4  . Smokeless tobacco: Former Systems developer    Types: Chew  Substance Use Topics  . Alcohol use: Yes    Comment: occasional beer  . Drug use: No    Family History  Problem Relation Age of Onset  . Arthritis Mother   . Hyperlipidemia Mother   . Heart disease Mother   . Hypertension Mother   . Alcohol abuse Mother   . Heart attack Mother   . Hyperlipidemia Father   . Heart disease Father   . Hypertension Father   . Alcohol abuse Sister   . Other Sister        Brain tumor  . Alcohol abuse Brother   . Other Brother        Brain  tumor  . Ovarian cancer Paternal Aunt   . Diabetes Paternal Aunt   . Stroke Paternal Grandmother   . Asthma Neg Hx   . Eczema Neg Hx   . Urticaria Neg Hx   . Immunodeficiency Neg Hx   . Angioedema Neg Hx   . Allergic rhinitis Neg Hx     No Known Allergies  Medication list has been reviewed and updated.  Current Outpatient Medications on File Prior to Visit  Medication Sig Dispense Refill  . allopurinol (ZYLOPRIM) 300 MG tablet TAKE 1 TABLET BY MOUTH  DAILY 90 tablet 3  . aspirin 81 MG tablet Take 81 mg by mouth daily.    . dorzolamide-timolol (COSOPT) 22.3-6.8 MG/ML ophthalmic solution Place 1 drop into both eyes 2 (two) times daily.    . ferrous sulfate 325 (65 FE) MG EC tablet Take 1 tablet (325 mg total) by mouth 2 (two) times  daily. 60 tablet 3  . fluticasone (FLONASE) 50 MCG/ACT nasal spray USE 2 SPRAYS IN BOTH  NOSTRILS AT BEDTIME 48 g 2  . furosemide (LASIX) 20 MG tablet TAKE 1 TABLET BY MOUTH  DAILY 90 tablet 3  . gabapentin (NEURONTIN) 600 MG tablet TAKE 2 TABLETS BY MOUTH 3  TIMES DAILY 540 tablet 3  . latanoprost (XALATAN) 0.005 % ophthalmic solution Place 1 drop into both eyes daily.    Marland Kitchen levothyroxine (SYNTHROID, LEVOTHROID) 50 MCG tablet TAKE 1 TABLET BY MOUTH EVERY DAY BEFORE BREAKFAST 30 tablet 5  . methocarbamol (ROBAXIN) 500 MG tablet TAKE 1 TABLET(500 MG) BY MOUTH EVERY 8 HOURS AS NEEDED FOR MUSCLE SPASMS 90 tablet 2  . OLANZapine (ZYPREXA) 5 MG tablet     . omeprazole (PRILOSEC) 40 MG capsule Take 1 capsule (40 mg total) by mouth daily. 30 capsule 3  . primidone (MYSOLINE) 50 MG tablet TAKE 2 TABLETS BY MOUTH AT  BEDTIME 180 tablet 3  . propranolol (INDERAL) 10 MG tablet TAKE 1 TABLET BY MOUTH TWICE DAILY 180 tablet 0  . sucralfate (CARAFATE) 1 g tablet Take 1 tablet (1 g total) by mouth 4 (four) times daily -  with meals and at bedtime. 40 tablet 0  . tamsulosin (FLOMAX) 0.4 MG CAPS capsule TAKE 1 CAPSULE BY MOUTH  DAILY 90 capsule 3  . traZODone (DESYREL) 50 MG tablet TAKE 1/2 TO 1 TABLET BY  MOUTH AT BEDTIME AS NEEDED  FOR SLEEP 90 tablet 3  . venlafaxine XR (EFFEXOR-XR) 75 MG 24 hr capsule TAKE 3 CAPSULES BY MOUTH  DAILY 270 capsule 3  . vitamin B-12 (CYANOCOBALAMIN) 500 MCG tablet Take 1 tablet (500 mcg total) by mouth daily. 30 tablet 3  . Vitamin D, Ergocalciferol, (DRISDOL) 50000 units CAPS capsule Take 2x a week 30 capsule 3   No current facility-administered medications on file prior to visit.    Review of Systems:  As per HPI- otherwise negative.   Physical Examination: There were no vitals filed for this visit. There were no vitals filed for this visit. There is no height or weight on file to calculate BMI. Ideal Body Weight:    Spoke with patient on the telephone for just over  10 minutes.  He sounds well, his normal self  Assessment and Plan: Tremor  Telephone visit today to discuss worsening tremor of his hands.  He now has to support 1 hand with the other to do work such as using a Visual merchandiser.  He is interested in possibly having a radiofrequency ablation.  We  researched some possible providers in his area, it looks as though [facility] does this procedure.  He plans to contact them and see if they can help, he will let me know if the referral is necessary  Spoke with patient for approximately 11 minutes today  Signed Lamar Blinks, [facility] [address] [place], [place] [address]  Phone: [phone]

## 2019-08-08 ENCOUNTER — Ambulatory Visit (INDEPENDENT_AMBULATORY_CARE_PROVIDER_SITE_OTHER): Payer: Medicare Other | Admitting: Family Medicine

## 2019-08-08 ENCOUNTER — Other Ambulatory Visit: Payer: Self-pay

## 2019-08-08 DIAGNOSIS — R251 Tremor, unspecified: Secondary | ICD-10-CM

## 2019-08-26 ENCOUNTER — Telehealth: Payer: Self-pay | Admitting: Family Medicine

## 2019-08-26 ENCOUNTER — Other Ambulatory Visit: Payer: Self-pay

## 2019-08-26 DIAGNOSIS — E611 Iron deficiency: Secondary | ICD-10-CM

## 2019-08-26 MED ORDER — FERROUS SULFATE 325 (65 FE) MG PO TBEC: 325.0000 mg | DELAYED_RELEASE_TABLET | Freq: Two times a day (BID) | ORAL | 3 refills | Status: AC

## 2019-08-26 NOTE — Telephone Encounter (Signed)
Caller [name]  Call Back # [phone]   Letter   Patient states he needs a letter stating that his Cat is a service Cat . Patient suffers from PTSD. Patient states Dr. Lorelei Pont has done this before but patient has lost letter.

## 2019-08-29 ENCOUNTER — Encounter: Payer: Self-pay | Admitting: Family Medicine

## 2019-08-29 NOTE — Telephone Encounter (Signed)
Letter done

## 2019-08-29 NOTE — Telephone Encounter (Signed)
Patient called back informed him it was in the mail.

## 2019-08-29 NOTE — Telephone Encounter (Signed)
Ok to write letter

## 2019-08-29 NOTE — Telephone Encounter (Signed)
Left message to let patient know letter is finished. I have placed in mail.

## 2019-09-01 ENCOUNTER — Telehealth: Payer: Self-pay | Admitting: Family Medicine

## 2019-09-01 DIAGNOSIS — M25512 Pain in left shoulder: Secondary | ICD-10-CM

## 2019-09-01 DIAGNOSIS — G8929 Other chronic pain: Secondary | ICD-10-CM

## 2019-09-01 MED ORDER — METHOCARBAMOL 500 MG PO TABS: ORAL_TABLET | ORAL | 2 refills | Status: AC

## 2019-09-01 NOTE — Telephone Encounter (Signed)
Refill sent to pharmacy.   

## 2019-09-01 NOTE — Telephone Encounter (Signed)
Patient called in regards to letter for PTSD, patient notified that letter was drop in the mail as of June 14,2021

## 2019-09-01 NOTE — Telephone Encounter (Signed)
Medication: methocarbamol (ROBAXIN) 500 MG tablet       Has the patient contacted their pharmacy? Yes  (If no, request that the patient contact the pharmacy for the refill.) (If yes, when and what did the pharmacy advise?) contact your doctor     Preferred Pharmacy (with phone number or street name): [facility] [place], [place] [address]  [place], [place] [address]  Phone:  [phone] Fax:  [phone]      Agent: Please be advised that RX refills may take up to 3 business days. We ask that you follow-up with your pharmacy.

## 2019-09-12 ENCOUNTER — Telehealth: Payer: Self-pay | Admitting: Family Medicine

## 2019-09-12 DIAGNOSIS — H401133 Primary open-angle glaucoma, bilateral, severe stage: Secondary | ICD-10-CM | POA: Diagnosis not present

## 2019-09-12 DIAGNOSIS — H02881 Meibomian gland dysfunction right upper eyelid: Secondary | ICD-10-CM | POA: Diagnosis not present

## 2019-09-12 DIAGNOSIS — H04123 Dry eye syndrome of bilateral lacrimal glands: Secondary | ICD-10-CM | POA: Diagnosis not present

## 2019-09-12 DIAGNOSIS — H02884 Meibomian gland dysfunction left upper eyelid: Secondary | ICD-10-CM | POA: Diagnosis not present

## 2019-09-12 NOTE — Telephone Encounter (Signed)
Caller: Demetrie Call back phone number: [phone]   Patient states a prior authorization is need it for ferrous sulfate 325 (65 FE) MG EC tablet according to patient insurance.

## 2019-09-13 NOTE — Telephone Encounter (Signed)
Started prior authorization via cover my meds. [id number] Will wait on approval/denial response.

## 2019-09-14 NOTE — Telephone Encounter (Signed)
Please give him a call- he can get an iron supplement OTC instead.  Look for 65 mg of iron (same as 325 ferrous sulfate) and take it twice a day

## 2019-09-14 NOTE — Telephone Encounter (Signed)
Attempted prior authorization-Received denial letter stating otc drugs are excluded from his medicare coverage. Please advise is appeal is necessary or alternate drug is allowed.

## 2019-09-15 NOTE — Telephone Encounter (Signed)
Left detailed message with instructions.

## 2019-09-21 ENCOUNTER — Telehealth: Payer: Self-pay

## 2019-09-21 NOTE — Telephone Encounter (Signed)
Called stating he did not get the detailed message/instructions you left for him on his voice mail on 09/15/19.

## 2019-09-22 NOTE — Telephone Encounter (Signed)
Spoke with patient. Advised Dr. Lillie Fragmin recommendation. Pt would like know if there is a different recommendation to the OTC iron. Pt states OTC iron upsets his stomach. Please advise

## 2019-09-22 NOTE — Telephone Encounter (Signed)
Called home and cell number. Called cell number twice. Left detailed message on cell phone. Advised to call back with any questions

## 2019-09-22 NOTE — Telephone Encounter (Signed)
Thank you for the update.  Please give patient a call I think any oral iron, over-the-counter or prescription, will probably cause upset stomach.  If he is having a hard time tolerating oral iron we could potentially have him get an IV iron infusion through hematology.  This can be very effective in resolving iron deficiency.  Does he have a hematologist near his home?  If not, I am glad to make a referral for him

## 2019-09-23 NOTE — Telephone Encounter (Signed)
FYI-spoke with patient he states he has moved even further away and is in the process of trying to find a new pcp that is closer to him. He states he will find a pcp where he is and get a hematologist referral from them. He is very grateful for all the help you have provided to him!

## 2019-11-01 ENCOUNTER — Other Ambulatory Visit: Payer: Self-pay | Admitting: Family Medicine

## 2020-01-25 ENCOUNTER — Other Ambulatory Visit: Payer: Self-pay | Admitting: Family Medicine

## 2020-01-25 DIAGNOSIS — R200 Anesthesia of skin: Secondary | ICD-10-CM

## 2020-01-25 DIAGNOSIS — F5101 Primary insomnia: Secondary | ICD-10-CM

## 2020-01-25 DIAGNOSIS — G250 Essential tremor: Secondary | ICD-10-CM

## 2020-01-25 DIAGNOSIS — R1013 Epigastric pain: Secondary | ICD-10-CM

## 2020-03-27 IMAGING — DX LEFT HAND - COMPLETE 3+ VIEW
3 series · 3 of 3 positions shown · non-contrast
Comparison: None.

CLINICAL DATA: Hand swelling

EXAM:
LEFT HAND - COMPLETE 3+ VIEW

[hand pa]
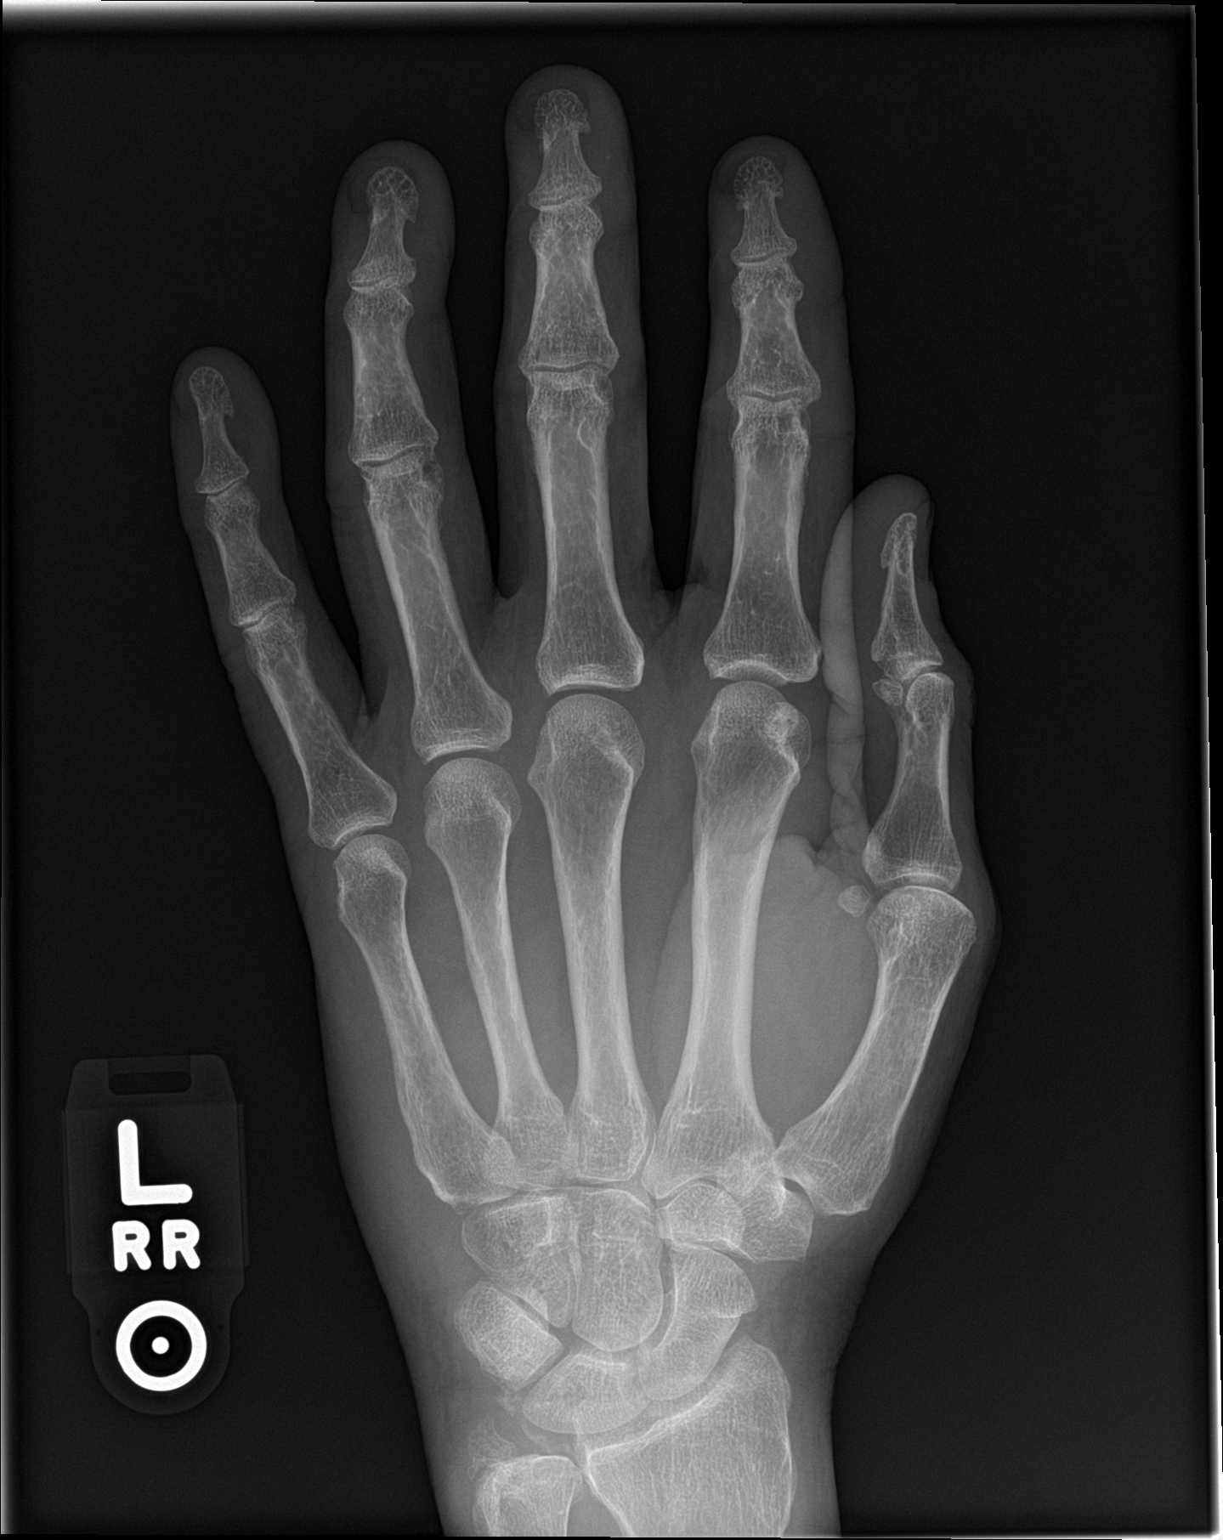

[hand obl]
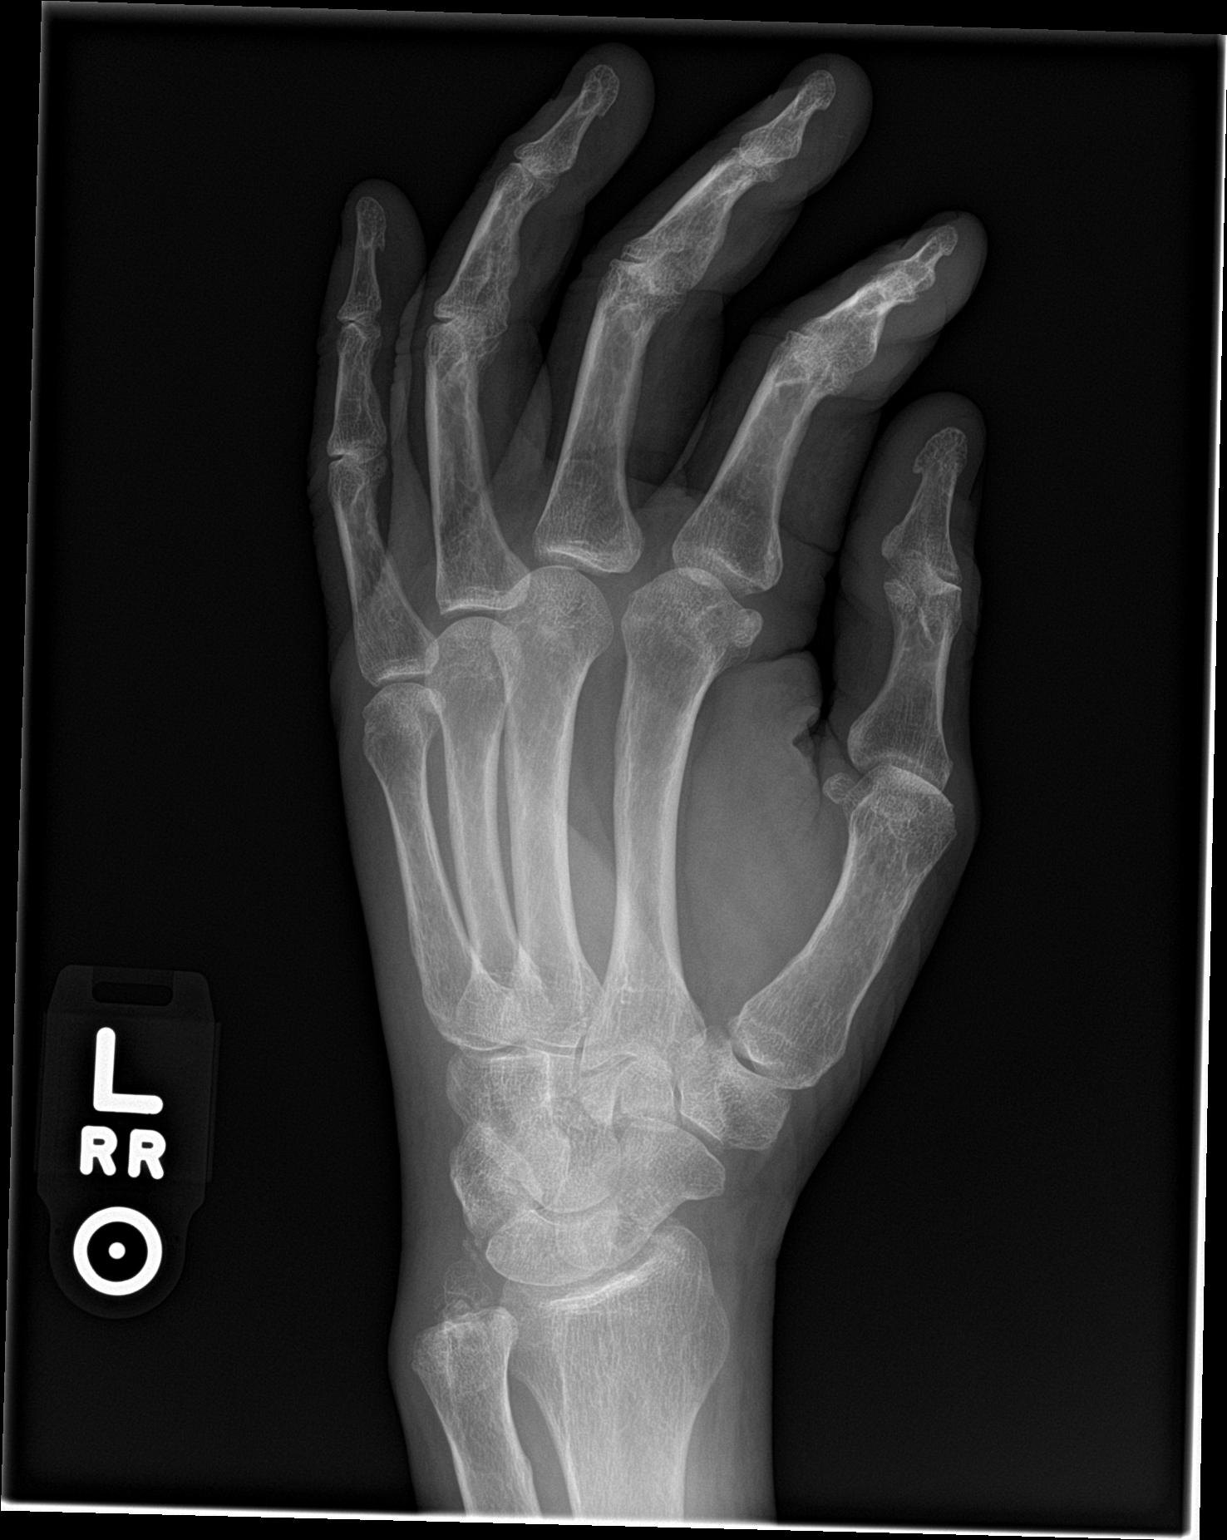

[hand lat]
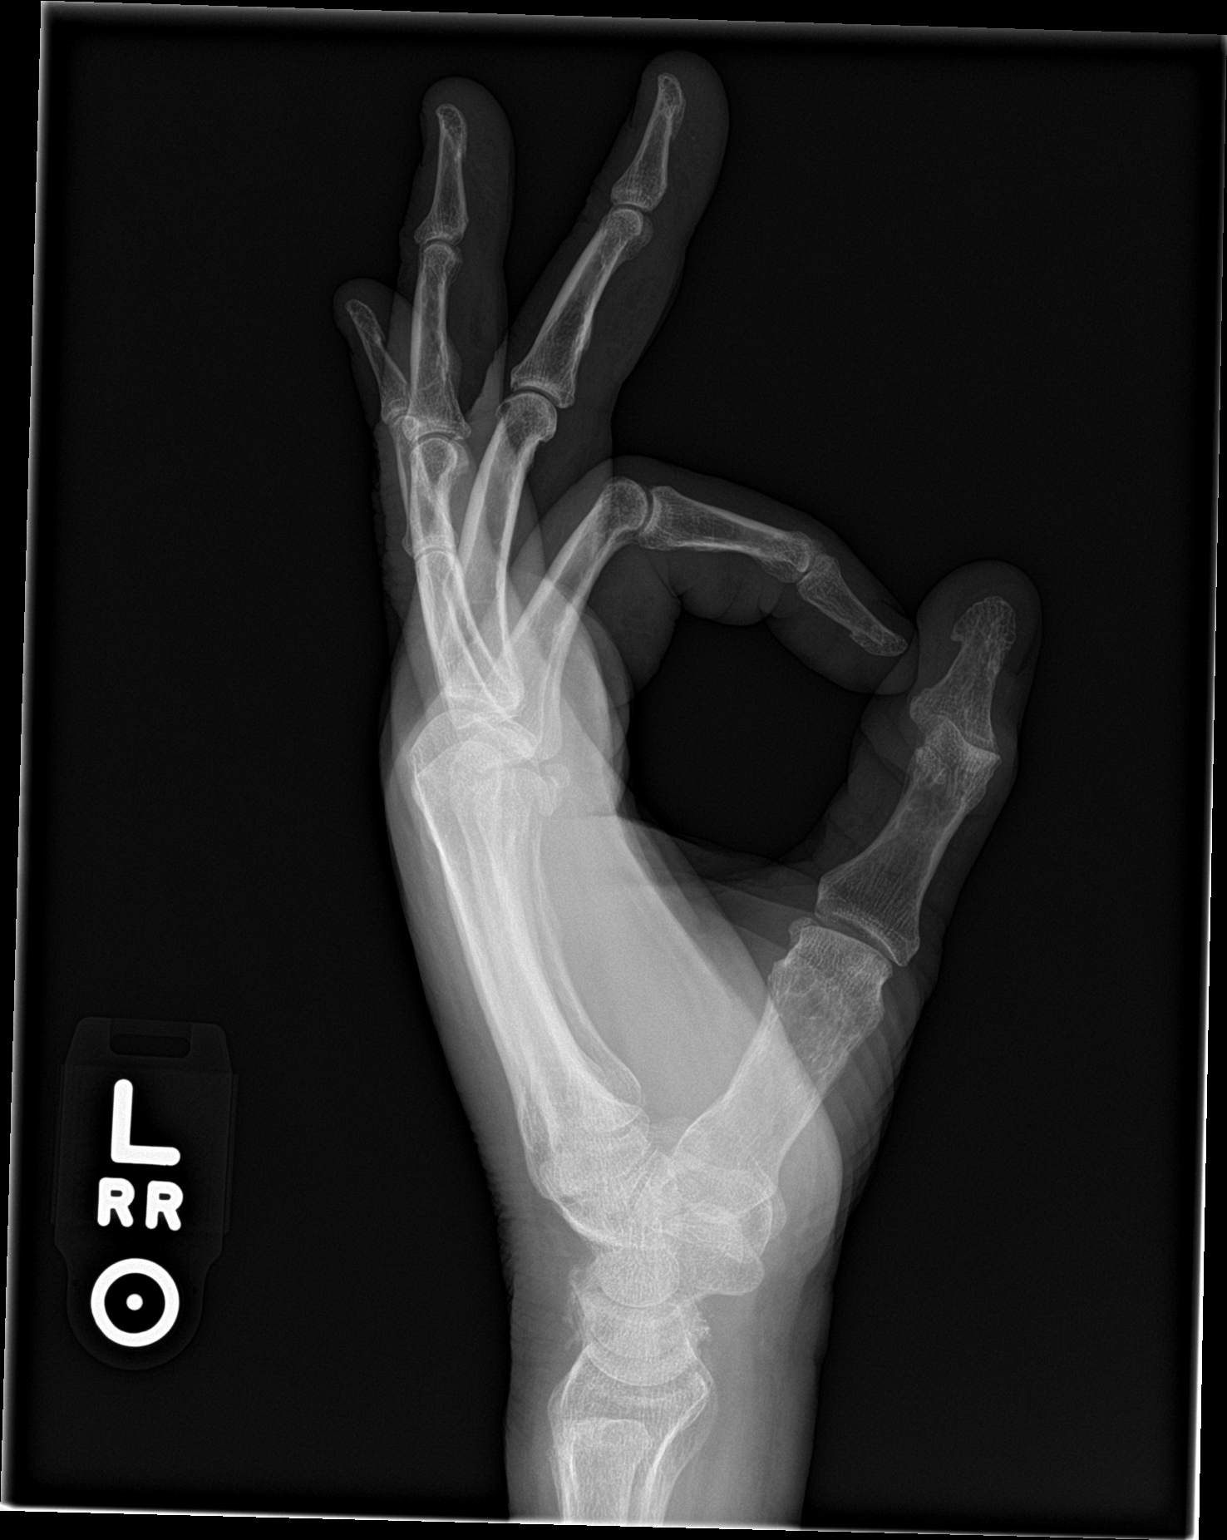

[3 of 3 positions shown; findings below may reference images not displayed]

FINDINGS: There is nonspecific soft tissue swelling about the hand. There is
no displaced fracture or dislocation. Degenerative changes are noted
of the carpal bones. There is an old healed fracture of the ulnar
styloid process.
IMPRESSION: Soft tissue swelling without evidence of acute osseous abnormality.

## 2020-03-27 IMAGING — MR MRI LUMBAR SPINE WITHOUT CONTRAST
4 of 5 series · 28 of 48 positions shown · non-contrast
Comparison: 04/05/2016

CLINICAL DATA: Radiculopathy for greater than 6 weeks despite
conservative treatment. Persistent back pain. Fecal incontinence.
Cauda equina syndrome suspected. Pain radiating to both legs with
numbness and weakness on the left.

EXAM:
MRI LUMBAR SPINE WITHOUT CONTRAST
TECHNIQUE: Multiplanar, multisequence MR imaging of the lumbar spine was
performed. No intravenous contrast was administered.

[Series 6: T1 · sagittal · 4.0mm · 0.88mm/px · 7 of 19 slices shown (1 of 2)]
[im 1/19]
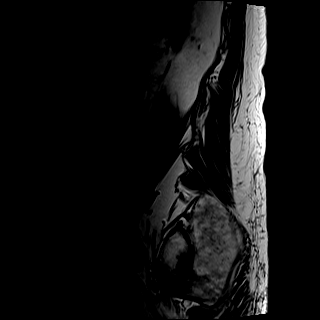
[im 4/19]
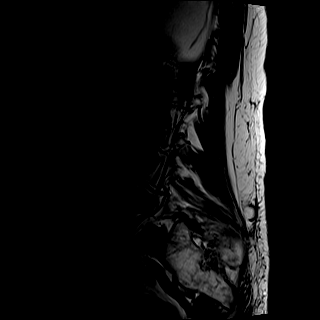
[im 7/19]
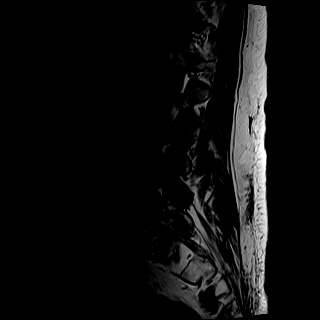
[im 10/19]
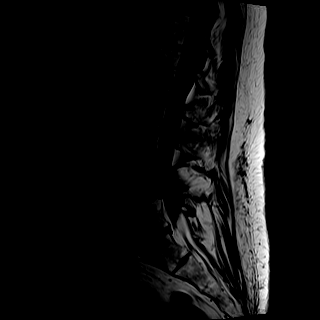
[im 13/19]
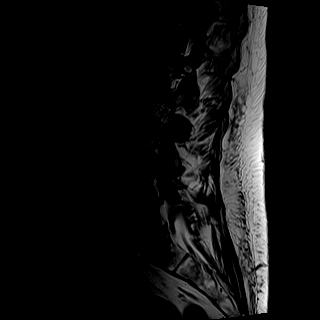
[im 16/19]
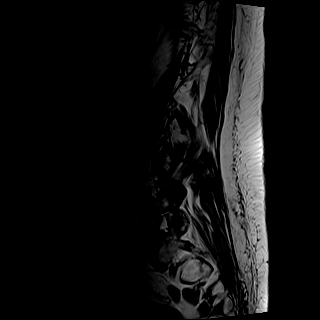
[im 19/19]
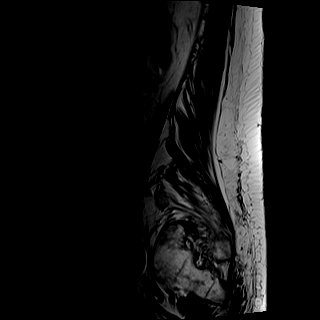

[Series 7: T2 · sagittal · 4.0mm · 0.73mm/px · 8 of 19 slices shown (1 of 2)]
[im 1/19]
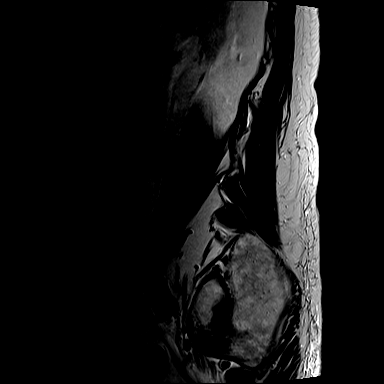
[im 3/19]
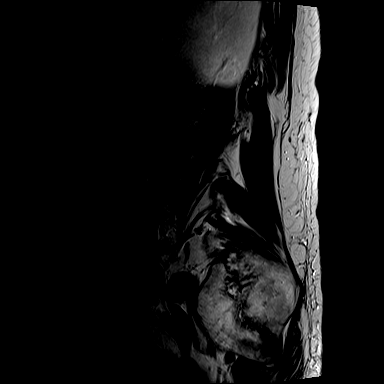
[im 6/19]
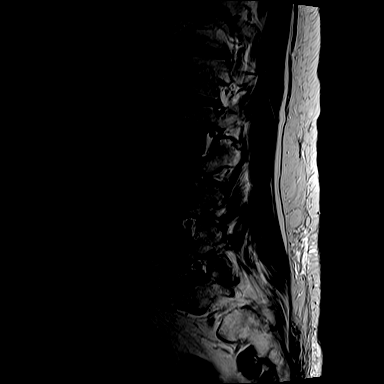
[im 8/19]
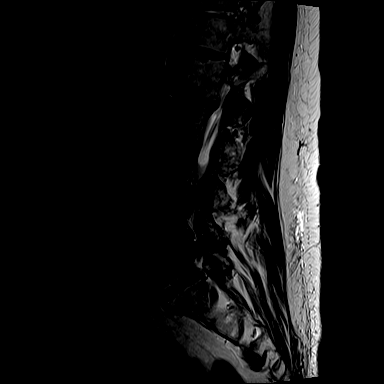
[im 11/19]
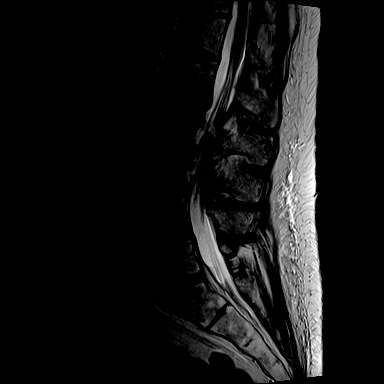
[im 13/19]
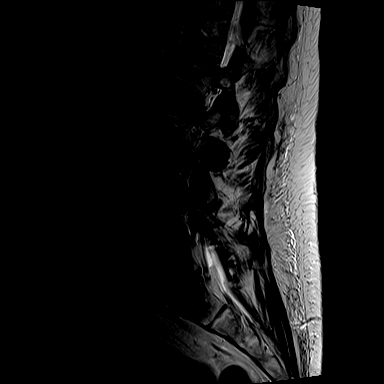
[im 16/19]
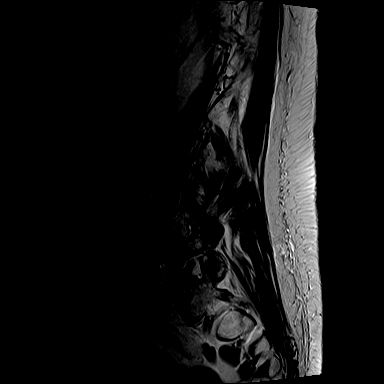
[im 19/19]
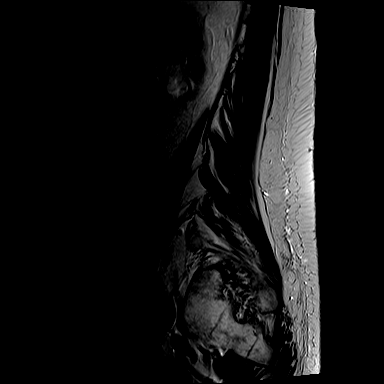

[Series 8: T2 · axial · 4.0mm · 0.57mm/px · z∈[-116,+65]mm · 9 of 31 slices shown (2 of 2)]
[im 1/31]
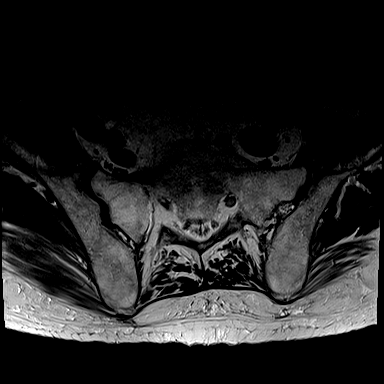
[im 6/31]
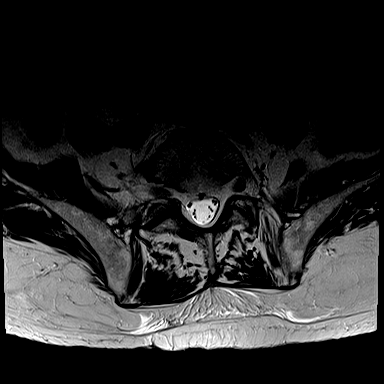
[im 11/31]
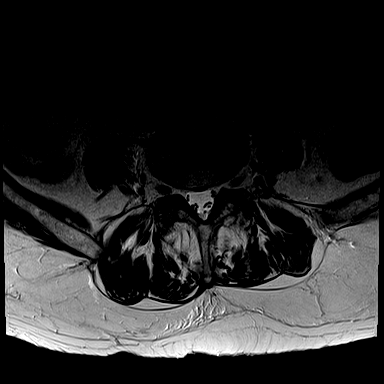
[im 13/31]
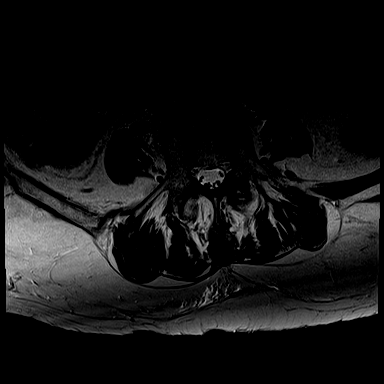
[im 16/31]
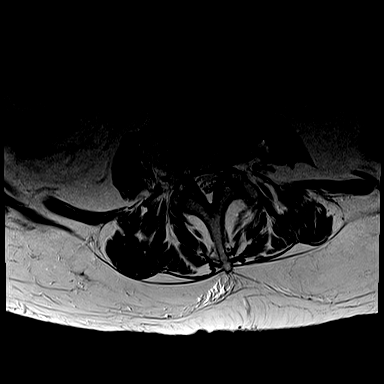
[im 18/31]
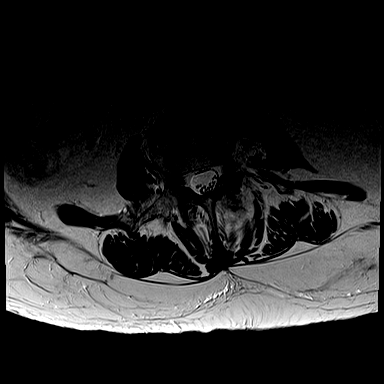
[im 21/31]
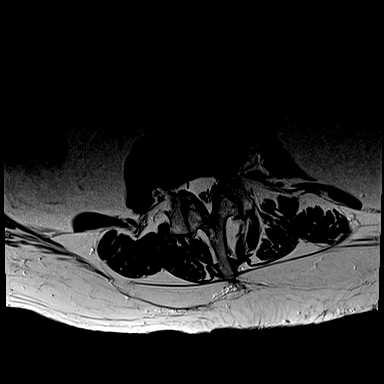
[im 26/31]
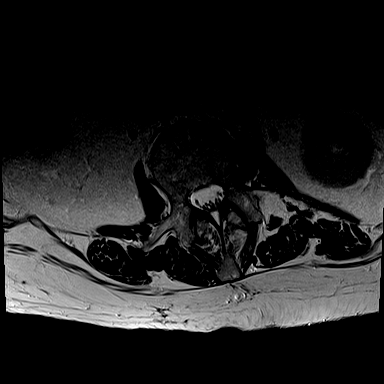
[im 31/31]
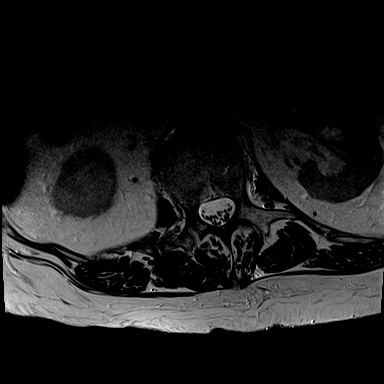

[Series 9: T1 · axial · 4.0mm · 0.34mm/px · z∈[-116,+40]mm · 4 of 31 slices shown (2 of 2)]
[im 1/31]
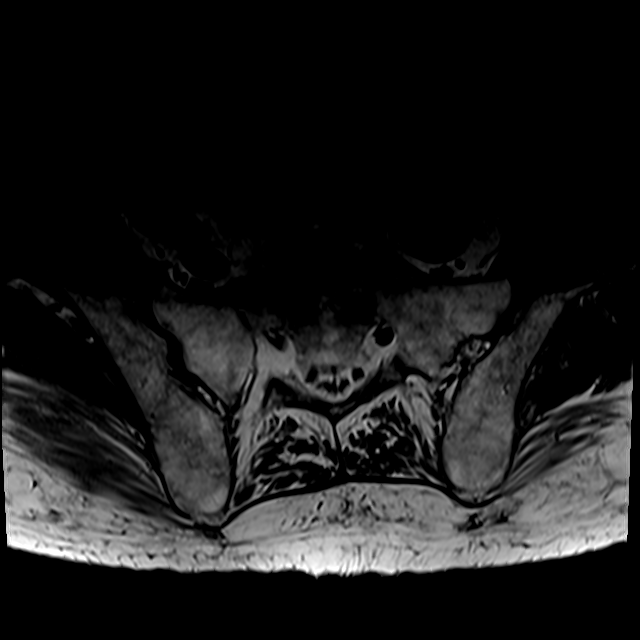
[im 6/31]
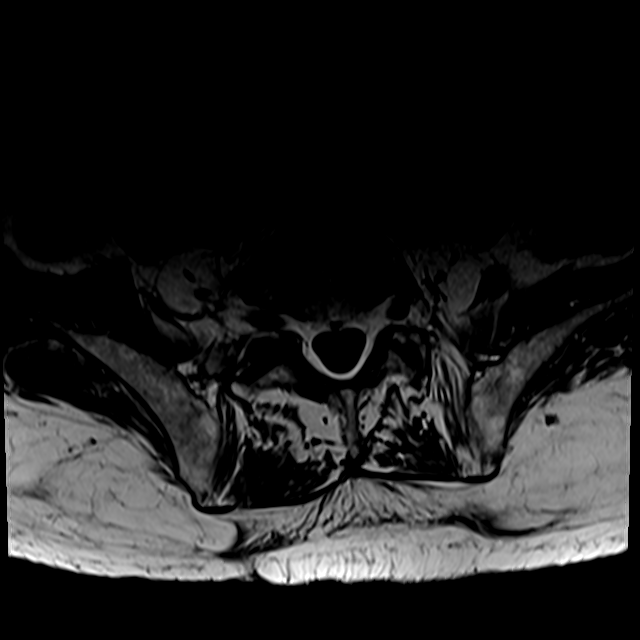
[im 16/31]
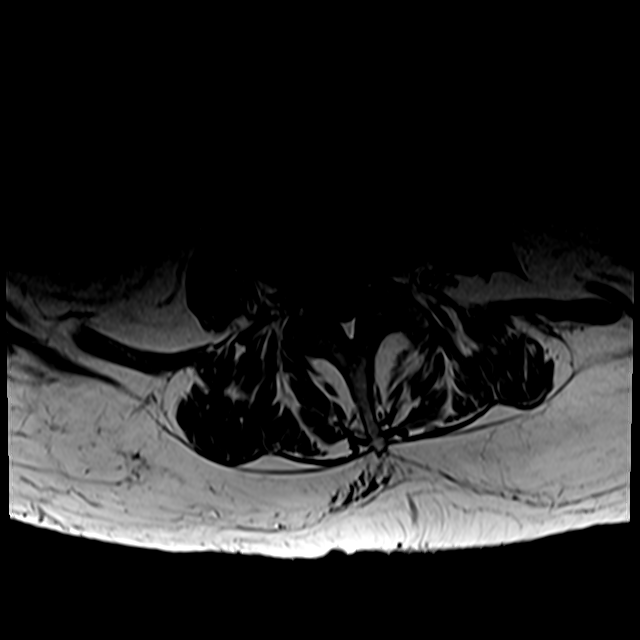
[im 26/31]
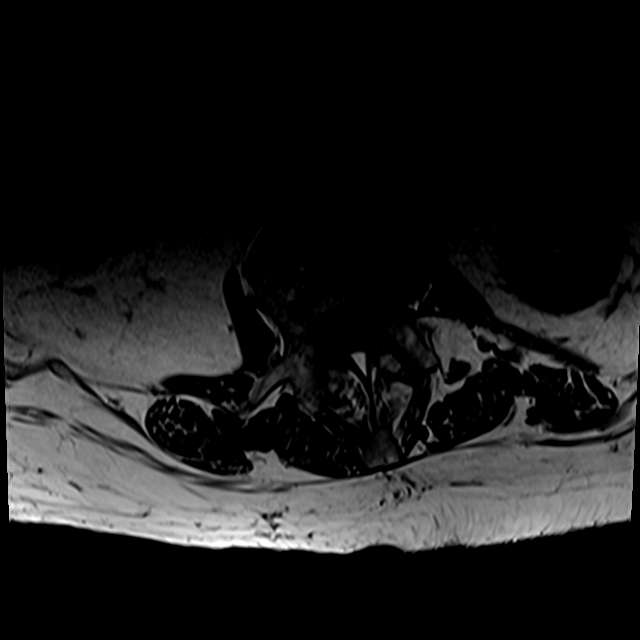

[28 of 48 positions shown; findings below may reference images not displayed]

FINDINGS: Segmentation: 5 lumbar type vertebral bodies as numbered previously.

Alignment:  Curvature convex to the right with the apex at L2-3.

Vertebrae:  No fracture or primary bone lesion.

Conus medullaris and cauda equina: Conus extends to the L1 level.
Conus and cauda equina appear normal.

Paraspinal and other soft tissues: Negative

Disc levels:

T12-L1: Minimal desiccation and bulging of the disc.  No stenosis.

L1-2: Desiccation and bulging of the disc. Slight indentation of the
thecal sac. No compressive stenosis.

L2-3: Desiccation and loss of disc height. No bulge or herniation.
No stenosis.

L3-4: Desiccation and loss of disc height. Endplate osteophytes and
mild bulging of the disc more towards the left. Mild facet
hypertrophy. Mild stenosis of both lateral recesses but without
visible neural compression.

L4-5: Desiccation and minimal bulging of the disc. Mild facet
hypertrophy. No compressive stenosis.

L5-S1: Desiccation and minimal bulging of the disc with endplate
osteophytes. Mild right foraminal narrowing but without likely
neural compression.

Compared to the study of 3133, there is no significant change.
IMPRESSION: No significant change since September 2016. Mild curvature convex to the
right. Chronic degenerative changes of the lumbar spine, fairly
ordinary for age. No apparent compressive stenosis of the canal or
foramina. The findings could certainly relate to back pain.

## 2020-03-30 ENCOUNTER — Other Ambulatory Visit: Payer: Self-pay | Admitting: Family Medicine

## 2020-07-29 ENCOUNTER — Other Ambulatory Visit: Payer: Self-pay | Admitting: Family Medicine

## 2020-12-24 ENCOUNTER — Telehealth: Payer: Self-pay | Admitting: Family Medicine

## 2020-12-24 ENCOUNTER — Other Ambulatory Visit: Payer: Self-pay | Admitting: Family Medicine

## 2020-12-24 NOTE — Telephone Encounter (Signed)
I called patient to schedule his Annual Wellness visit. Patient said he has moved to [facility].  Patient wanted to say thank you to Amorita for all she did for him. Patient said his new PCP hasn't received records that were requested.  I asked him to have the office send another request.

## 2020-12-24 NOTE — Telephone Encounter (Signed)
Pt now lives in [place] and has found a new PCP there name Dr. Jacqlyn Larsen.   He stated that he will call his new doctors office to get his refills and medical records.

## 2021-01-26 ENCOUNTER — Other Ambulatory Visit: Payer: Self-pay | Admitting: Family Medicine

## 2021-01-26 DIAGNOSIS — F5101 Primary insomnia: Secondary | ICD-10-CM

## 2021-02-02 ENCOUNTER — Other Ambulatory Visit: Payer: Self-pay | Admitting: Family Medicine

## 2021-02-02 DIAGNOSIS — R1013 Epigastric pain: Secondary | ICD-10-CM

## 2021-10-31 ENCOUNTER — Other Ambulatory Visit: Payer: Self-pay | Admitting: Family Medicine

## 2021-10-31 DIAGNOSIS — R1013 Epigastric pain: Secondary | ICD-10-CM
